# Patient Record
Sex: Female | Born: 1990 | Race: Black or African American | Hispanic: No | Marital: Single | Smoking: Never smoker
Health system: Southern US, Community
[De-identification: ages and names within clinical notes are randomized; demographics above are authoritative.]

## PROBLEM LIST (undated history)

## (undated) ENCOUNTER — Inpatient Hospital Stay (HOSPITAL_COMMUNITY): Payer: Self-pay

## (undated) DIAGNOSIS — O1490 Unspecified pre-eclampsia, unspecified trimester: Secondary | ICD-10-CM

## (undated) DIAGNOSIS — D649 Anemia, unspecified: Secondary | ICD-10-CM

## (undated) HISTORY — PX: NO PAST SURGERIES: SHX2092

## (undated) HISTORY — DX: Unspecified pre-eclampsia, unspecified trimester: O14.90

---

## 2014-07-10 ENCOUNTER — Encounter (HOSPITAL_COMMUNITY): Payer: Self-pay | Admitting: Emergency Medicine

## 2014-07-10 ENCOUNTER — Emergency Department (HOSPITAL_COMMUNITY)
Admission: EM | Admit: 2014-07-10 | Discharge: 2014-07-10 | Disposition: A | Discharge status: Home / Self Care | Payer: Medicaid Other | Attending: Emergency Medicine | Admitting: Emergency Medicine

## 2014-07-10 DIAGNOSIS — O9989 Other specified diseases and conditions complicating pregnancy, childbirth and the puerperium: Secondary | ICD-10-CM | POA: Diagnosis not present

## 2014-07-10 DIAGNOSIS — R5381 Other malaise: Secondary | ICD-10-CM | POA: Insufficient documentation

## 2014-07-10 DIAGNOSIS — R5383 Other fatigue: Secondary | ICD-10-CM

## 2014-07-10 DIAGNOSIS — O99019 Anemia complicating pregnancy, unspecified trimester: Secondary | ICD-10-CM | POA: Diagnosis not present

## 2014-07-10 DIAGNOSIS — D649 Anemia, unspecified: Secondary | ICD-10-CM | POA: Diagnosis not present

## 2014-07-10 DIAGNOSIS — Z3490 Encounter for supervision of normal pregnancy, unspecified, unspecified trimester: Secondary | ICD-10-CM

## 2014-07-10 HISTORY — DX: Anemia, unspecified: D64.9

## 2014-07-10 LAB — CBC
HCT: 39 % (ref 36.0–46.0)
Hemoglobin: 13.3 g/dL (ref 12.0–15.0)
MCH: 29.3 pg (ref 26.0–34.0)
MCHC: 34.1 g/dL (ref 30.0–36.0)
MCV: 85.9 fL (ref 78.0–100.0)
PLATELETS: 239 10*3/uL (ref 150–400)
RBC: 4.54 MIL/uL (ref 3.87–5.11)
RDW: 12.1 % (ref 11.5–15.5)
WBC: 5.1 10*3/uL (ref 4.0–10.5)

## 2014-07-10 LAB — POC URINE PREG, ED: Preg Test, Ur: POSITIVE — AB

## 2014-07-10 MED ORDER — PRENATAL COMPLETE 14-0.4 MG PO TABS: 1.0000 | ORAL_TABLET | Freq: Every morning | ORAL | Status: DC

## 2014-07-10 NOTE — ED Notes (Signed)
Patient presents stating that she donated blood today and is now feeling tired.  Took 3 at home pregnancy tests and they were all negative.

## 2014-07-10 NOTE — ED Notes (Signed)
Hx. Of anemia. Donated plasma today. Became weak. No menstrual; cycle for 3 months.

## 2014-07-10 NOTE — ED Provider Notes (Signed)
CSN: [id number]     Arrival date & time 07/10/14  2029 History   First MD Initiated Contact with Patient 07/10/14 2123     Chief Complaint  Patient presents with  . Anemia  . Weakness     (Consider location/radiation/quality/duration/timing/severity/associated sxs/prior Treatment) HPI Comments: Patient reports, that she has a history of anemia, but has not had a blood level checked recently.  She donated plasma today.  Did not drink any fluids after she donated plasma and when she went for a walk.  She felt slightly dizzy.  This has resolved.  She also reports, that she has not had a regular menstrual cycle since stopping her birth control in many.  She states, that she took several home pregnancy tests all negative.  The last being a week, and a half, ago.  She denies any dysuria, fever, abdominal pain, nausea, vomiting, rapid heartbeat, shortness of breath.  Vaginal discharge, or bleeding  Patient is a 23 y.o. female presenting with anemia and weakness. The history is provided by the patient.  Anemia This is a chronic problem. The problem occurs constantly. The problem has been unchanged. Associated symptoms include weakness. Pertinent negatives include no abdominal pain, chest pain, diaphoresis, fever, headaches, nausea or vomiting. The symptoms are aggravated by walking. She has tried nothing for the symptoms. The treatment provided no relief.  Weakness Associated symptoms include weakness. Pertinent negatives include no abdominal pain, chest pain, diaphoresis, fever, headaches, nausea or vomiting.    Past Medical History  Diagnosis Date  . Anemia    History reviewed. No pertinent past surgical history. History reviewed. No pertinent family history. History  Substance Use Topics  . Smoking status: Never Smoker   . Smokeless tobacco: Not on file  . Alcohol Use: No   OB History   Grav Para Term Preterm Abortions TAB SAB Ect Mult Living                 Review of Systems   Constitutional: Negative for fever, diaphoresis, activity change and appetite change.  Respiratory: Negative for shortness of breath.   Cardiovascular: Negative for chest pain.  Gastrointestinal: Negative for nausea, vomiting and abdominal pain.  Genitourinary: Negative for vaginal bleeding and vaginal discharge.  Neurological: Positive for weakness. Negative for dizziness and headaches.  All other systems reviewed and are negative.     Allergies  Review of patient's allergies indicates no known allergies.  Home Medications   Prior to Admission medications   Medication Sig Start Date End Date Taking? Authorizing Provider  Prenatal Vit-Fe Fumarate-FA (PRENATAL COMPLETE) 14-0.4 MG TABS Take 1 tablet by mouth every morning. 07/10/14   Arman Filter, NP   BP 125/74  Pulse 83  Temp(Src) 98.2 F (36.8 C)  Resp 18  Wt 112 lb 4 oz (50.916 kg)  SpO2 98%  LMP 04/09/2014 Physical Exam  Nursing note and vitals reviewed. Constitutional: She is oriented to person, place, and time. She appears well-developed and well-nourished.  HENT:  Head: Normocephalic.  Eyes: Pupils are equal, round, and reactive to light.  Neck: Normal range of motion.  Cardiovascular: Normal rate and regular rhythm.   Pulmonary/Chest: Effort normal and breath sounds normal.  Abdominal: Soft. Bowel sounds are normal. She exhibits no distension. There is no tenderness.  Musculoskeletal: Normal range of motion.  Neurological: She is alert and oriented to person, place, and time.  Skin: Skin is warm. No pallor.    ED Course  Procedures (including critical care time) Labs Review  Labs Reviewed  POC URINE PREG, ED - Abnormal; Notable for the following:    Preg Test, Ur POSITIVE (*)    All other components within normal limits  CBC    Imaging Review No results found.   EKG Interpretation None     patient's CBC shows that she is not anemic.  She does not have any indication for infection, but her urine  pregnancy test is positive she'll be started on prenatal vitamins [facility]  MDM   Final diagnoses:  Pregnancy         Arman FilterGail K Meosha Castanon, NP 07/10/14 2210

## 2014-07-10 NOTE — Discharge Instructions (Signed)
Congratulations You have been given a prescription for prenatal vitamins.  Please take these as directed once a day  You have been given a referral to women's OB/GYN clinic, please call and make an appointment to start her prenatal care

## 2014-07-11 NOTE — ED Provider Notes (Signed)
Medical screening examination/treatment/procedure(s) were performed by non-physician practitioner and as supervising physician I was immediately available for consultation/collaboration.   EKG Interpretation None       Juliet RudeNathan R. Rubin PayorPickering, MD 07/11/14 [phone]

## 2014-08-07 LAB — OB RESULTS CONSOLE ANTIBODY SCREEN: Antibody Screen: NEGATIVE

## 2014-08-07 LAB — OB RESULTS CONSOLE GC/CHLAMYDIA
Chlamydia: NEGATIVE
Gonorrhea: NEGATIVE

## 2014-08-07 LAB — OB RESULTS CONSOLE ABO/RH: RH Type: POSITIVE

## 2014-08-07 LAB — OB RESULTS CONSOLE RUBELLA ANTIBODY, IGM: Rubella: IMMUNE

## 2014-08-07 LAB — OB RESULTS CONSOLE HEPATITIS B SURFACE ANTIGEN: Hepatitis B Surface Ag: NEGATIVE

## 2014-08-07 LAB — OB RESULTS CONSOLE RPR: RPR: NONREACTIVE

## 2014-08-07 LAB — OB RESULTS CONSOLE HIV ANTIBODY (ROUTINE TESTING): HIV: NONREACTIVE

## 2014-08-14 ENCOUNTER — Encounter: Payer: Self-pay | Admitting: Obstetrics and Gynecology

## 2014-11-27 NOTE — L&D Delivery Note (Signed)
Final Labor Progress Note At 2245 pt reports an increased in rectal pressure.  VE 4-5/80/0.  FHR remained reassuring with occasional variabile decelerations.  Vaginal Delivery Note Spontaneous rupture of membranes today, at 2255, clear.  GBS was positive, PCN not given in time.  Cervical dilation was complete at  2300.  NICHD Category 2.    Pushing with guidance began at  2308.   After 10 minutes of pushing the head, shoulders and the body of a viable female infant "Crystal Santiago" delivered spontaneously with maternal effort in the ROA position at 2318   Tight cord around each ankle, easily unraveled.   With vigorous tone and a slight delay in the cry, the infant was placed on moms abd.  After the umbilical cord was clamped it was cut the infant was handed to the NICU team staff for immediate assessment .  Cord blood was obtained for evaluation.   Spontaneous delivery of a intact placenta with a 3 vessel cord via Shultz at  2326.   Episiotomy: None   The vulva, perineum, vaginal vault, rectum and cervix were inspected and revealed a 2 degree vaginal which was repair using a 3-0 vicryl on a CT needle, a right periurethral laceration which was repaired using a 3-0 vicryl on a CT and a superficial left labial  (hemostatic, not repaired ) needle with 30cc of 1% lidocaine.  Postpartum pitocin as ordered.  Uterus boggy immediately after delivery of placenta - fundal massage and 1000 mcg of Cytotec PR.   EBL 250, Pt hemodynamically stable.   Sponge, laps and needle count correct and verified with the primary care nurse.  Attending MD available at all times.    Mom and baby were left in stable condition, baby skin to skin. Routine postpartum orders   Mother desires Depo for contraception    Placenta to pathology: NO     Cord Gases sent to lab: NO Cord blood sent to lab: YES   APGARS:  9 at 1 minute and 9 at 5 minutes assigned by NICU Weight:. 4lb 8.8oz    Both mom and baby were left in  stable condition     Crystal Santiago, CNM, MSN 02/20/2015. 12:03 AM

## 2015-01-18 ENCOUNTER — Inpatient Hospital Stay (HOSPITAL_COMMUNITY)
Admission: AD | Admit: 2015-01-18 | Discharge: 2015-01-18 | Disposition: A | Discharge status: Home / Self Care | Payer: Medicaid Other | Source: Ambulatory Visit | Attending: Obstetrics and Gynecology | Admitting: Obstetrics and Gynecology

## 2015-01-18 ENCOUNTER — Encounter (HOSPITAL_COMMUNITY): Payer: Self-pay

## 2015-01-18 DIAGNOSIS — O365930 Maternal care for other known or suspected poor fetal growth, third trimester, not applicable or unspecified: Secondary | ICD-10-CM | POA: Diagnosis not present

## 2015-01-18 DIAGNOSIS — Z3A32 32 weeks gestation of pregnancy: Secondary | ICD-10-CM | POA: Insufficient documentation

## 2015-01-18 NOTE — Discharge Instructions (Signed)
Fetal Biophysical Profile °This is a test that measures five different variables of the fetus: Heart rate, breathing movement, total movement of the baby, fetal muscle tone, the amount of amniotic fluid, and the heart rate activity of the fetus. The five variables are measured individually and contribute either a 2 or a 0 to the overall scoring of the test. The measurements are as follows: °· Fetal heart rate activity. This is measured and scored in the same way as a non-stress test. The fetal heart rate is considered reactive when there are movement-associated fetal heart rate increases of at least 15 beats per minute above baseline, and 15 seconds in duration over a 20-minute period. A score of 2 is given for reactivity, and a score of 0 indicates that the fetal heart rate is non-reactive. °· Fetal breathing movements. This is scored based on fetal breathing movements and indicate fetal well-being. Their absence may indicate a low oxygen level for the fetus. Fetal breathing increases in frequency and uniformity after the 36th week of pregnancy. To earn a score of 2, the fetus must have at least one episode of fetal breathing lasting at least 60 seconds within a 30-minute observation. Absence of this breathing is scored a 0 on the BPP. °· Fetal body movements. Fetal activity is a reflection of brain integrity and function. The presence of at least three episodes of fetal movements within a 30-minute period is given a score of 2. A score of 0 is given with two or less movements in this time period. Fetal activity is highest 1 to 3 hours after the mother has eaten a meal. °· Fetal tone. In the uterus, the fetus is normally in a position of flexion. This means the head is bent down towards the knees. The fetus also stretches, rolls, and moves in the uterus. The arms, legs, trunk, and head may be flexed and extended. A score of 2 is earned when there is at least one episode of active extension with return flexion. A  score of 0 is given for slow extension with a return to only partial flexion. Fetal movement not followed by return to flexion, limbs or spine in extension, and an open fetal hand score 0. °· Amniotic fluid volume. Amniotic fluid volume has been demonstrated to be a good method of predicting fetal distress. Too little amniotic fluid has been associated with fetal abnormalities, slow uterine growth, and over due pregnancy. A score of 2 is given for this when there is at least one pocket of amniotic fluid that measures 1 cm in a specific area. A score of 0 indicates either that fluid is absent in most areas of the uterine cavity or that the largest pocket of fluid measures less than 1 cm. °PREPARATION FOR TEST °No preparation or fasting is necessary. °NORMAL FINDINGS °· A score of 8-10 points (if amniotic fluid volume is adequate). °· Possible critical values: Less than 4 may necessitate immediate delivery of fetus. °Ranges for normal findings may vary among different laboratories and hospitals. You should always check with your doctor after having lab work or other tests done to discuss the meaning of your test results and whether your values are considered within normal limits. °MEANING OF TEST  °Your caregiver will go over the test results with you and discuss the importance and meaning of your results, as well as treatment options and the need for additional tests if necessary. °OBTAINING THE TEST RESULTS  °It is your responsibility to obtain your test   results. Ask the lab or department performing the test when and how you will get your results. °Document Released: 03/16/2005 Document Revised: 02/05/2012 Document Reviewed: 10/23/2008 °ExitCare® Patient Information ©2015 [employer]. This information is not intended to replace advice given to you by your health care provider. Make sure you discuss any questions you have with your health care provider. ° °

## 2015-01-18 NOTE — MAU Provider Note (Signed)
History    Crystal Santiago is a 23y.o. G1P0 at 32.6wks who presents, from office, for NST.  Patient had BPP today of 6/8 with -2 for breathing.  Receiving BPP for SGA, but most recent growth US with fetus in 13th%ile.  Patient reports some occasional contractions, good fetal movement, no Lof or VB.   There are no active problems to display for this patient.   Chief Complaint  Patient presents with  . Non-stress Test   HPI  OB History    Gravida Para Term Preterm AB TAB SAB Ectopic Multiple Living   1               Past Medical History  Diagnosis Date  . Anemia     History reviewed. No pertinent past surgical history.  History reviewed. No pertinent family history.  History  Substance Use Topics  . Smoking status: Never Smoker   . Smokeless tobacco: Not on file  . Alcohol Use: No    Allergies: No Known Allergies  Prescriptions prior to admission  Medication Sig Dispense Refill Last Dose  . Prenatal Vit-Fe Fumarate-FA (PRENATAL COMPLETE) 14-0.4 MG TABS Take 1 tablet by mouth every morning. 60 each 0 01/17/2015 at 2100    ROS  See HPI Above Physical Exam   Blood pressure 120/73, pulse 69, temperature 98 F (36.7 C), temperature source Oral, resp. rate 18, last menstrual period 04/09/2014.  Physical Exam FHR: 125 bpm, Mod Var, -Decels, +Accels UC: Irregular, graphs mild ED Course  Assessment: IUP at 32.6wks Cat I FT Reactive NST SGA  Plan: -NST reactive in 20 minutes -Overall Antenatal testing today 8/10 -Reasurring -Follow up in office for repeat BPP next week -PTL Precautions -Who and when to call for pregnancy related concerns -Encouraged to call if any questions or concerns arise prior to next scheduled office visit.   Tracye Szuch LYNN CNM, MSN 01/18/2015 7:03 PM

## 2015-01-18 NOTE — MAU Note (Signed)
Pt had 6/8 BPP in office, sent to MAU for NST.

## 2015-02-15 LAB — US OB FOLLOW UP

## 2015-02-16 ENCOUNTER — Other Ambulatory Visit (HOSPITAL_COMMUNITY): Payer: Self-pay | Admitting: Obstetrics and Gynecology

## 2015-02-16 DIAGNOSIS — O365933 Maternal care for other known or suspected poor fetal growth, third trimester, fetus 3: Secondary | ICD-10-CM

## 2015-02-16 DIAGNOSIS — O26843 Uterine size-date discrepancy, third trimester: Secondary | ICD-10-CM

## 2015-02-16 LAB — OB RESULTS CONSOLE GBS: GBS: POSITIVE

## 2015-02-17 ENCOUNTER — Ambulatory Visit (HOSPITAL_COMMUNITY)
Admission: RE | Admit: 2015-02-17 | Discharge: 2015-02-17 | Disposition: A | Discharge status: Home / Self Care | Payer: Medicaid Other | Source: Ambulatory Visit | Attending: Obstetrics and Gynecology | Admitting: Obstetrics and Gynecology

## 2015-02-17 ENCOUNTER — Encounter (HOSPITAL_COMMUNITY): Payer: Self-pay

## 2015-02-17 ENCOUNTER — Other Ambulatory Visit: Payer: Self-pay | Admitting: Obstetrics and Gynecology

## 2015-02-17 DIAGNOSIS — O365933 Maternal care for other known or suspected poor fetal growth, third trimester, fetus 3: Secondary | ICD-10-CM

## 2015-02-17 DIAGNOSIS — O26843 Uterine size-date discrepancy, third trimester: Secondary | ICD-10-CM | POA: Diagnosis not present

## 2015-02-18 ENCOUNTER — Encounter (HOSPITAL_COMMUNITY): Payer: Self-pay | Admitting: *Deleted

## 2015-02-18 ENCOUNTER — Encounter (HOSPITAL_COMMUNITY): Payer: Self-pay | Admitting: Obstetrics and Gynecology

## 2015-02-18 ENCOUNTER — Telehealth (HOSPITAL_COMMUNITY): Payer: Self-pay | Admitting: *Deleted

## 2015-02-18 NOTE — Telephone Encounter (Signed)
Preadmission screen  

## 2015-02-19 ENCOUNTER — Inpatient Hospital Stay (HOSPITAL_COMMUNITY): Payer: Medicaid Other

## 2015-02-19 ENCOUNTER — Other Ambulatory Visit (HOSPITAL_COMMUNITY): Payer: Self-pay | Admitting: Certified Nurse Midwife

## 2015-02-19 ENCOUNTER — Inpatient Hospital Stay (HOSPITAL_COMMUNITY)
Admission: AD | Admit: 2015-02-19 | Discharge: 2015-02-22 | DRG: 774 | Disposition: A | Discharge status: Home / Self Care | Payer: Medicaid Other | Source: Ambulatory Visit | Attending: Obstetrics and Gynecology | Admitting: Obstetrics and Gynecology

## 2015-02-19 ENCOUNTER — Encounter (HOSPITAL_COMMUNITY): Payer: Self-pay | Admitting: *Deleted

## 2015-02-19 DIAGNOSIS — Z3A37 37 weeks gestation of pregnancy: Secondary | ICD-10-CM | POA: Diagnosis present

## 2015-02-19 DIAGNOSIS — O99824 Streptococcus B carrier state complicating childbirth: Secondary | ICD-10-CM | POA: Diagnosis present

## 2015-02-19 DIAGNOSIS — O1493 Unspecified pre-eclampsia, third trimester: Secondary | ICD-10-CM | POA: Diagnosis present

## 2015-02-19 DIAGNOSIS — Z22330 Carrier of Group B streptococcus: Secondary | ICD-10-CM

## 2015-02-19 DIAGNOSIS — O365930 Maternal care for other known or suspected poor fetal growth, third trimester, not applicable or unspecified: Secondary | ICD-10-CM | POA: Diagnosis present

## 2015-02-19 DIAGNOSIS — IMO0002 Reserved for concepts with insufficient information to code with codable children: Secondary | ICD-10-CM | POA: Diagnosis present

## 2015-02-19 DIAGNOSIS — Z3483 Encounter for supervision of other normal pregnancy, third trimester: Secondary | ICD-10-CM | POA: Diagnosis present

## 2015-02-19 LAB — COMPREHENSIVE METABOLIC PANEL
ALBUMIN: 3 g/dL — AB (ref 3.5–5.2)
ALK PHOS: 205 U/L — AB (ref 39–117)
ALT: 10 U/L (ref 0–35)
AST: 19 U/L (ref 0–37)
Anion gap: 5 (ref 5–15)
BILIRUBIN TOTAL: 0.3 mg/dL (ref 0.3–1.2)
BUN: 10 mg/dL (ref 6–23)
CHLORIDE: 104 mmol/L (ref 96–112)
CO2: 26 mmol/L (ref 19–32)
CREATININE: 0.92 mg/dL (ref 0.50–1.10)
Calcium: 8.2 mg/dL — ABNORMAL LOW (ref 8.4–10.5)
GFR calc Af Amer: >90 mL/min (ref >90)
GFR, EST NON AFRICAN AMERICAN: 87 mL/min — AB (ref >90)
Glucose, Bld: 100 mg/dL — ABNORMAL HIGH (ref 70–99)
POTASSIUM: 3.9 mmol/L (ref 3.5–5.1)
Sodium: 135 mmol/L (ref 135–145)
Total Protein: 6.6 g/dL (ref 6.0–8.3)

## 2015-02-19 LAB — PROTEIN / CREATININE RATIO, URINE
Creatinine, Urine: 66.29 mg/dL
Protein Creatinine Ratio: 1.58 — ABNORMAL HIGH (ref 0.00–0.15)
TOTAL PROTEIN, URINE: 105 mg/dL

## 2015-02-19 LAB — LACTATE DEHYDROGENASE: LDH: 187 U/L (ref 94–250)

## 2015-02-19 LAB — CBC
HCT: 33.9 % — ABNORMAL LOW (ref 36.0–46.0)
Hemoglobin: 11 g/dL — ABNORMAL LOW (ref 12.0–15.0)
MCH: 30.1 pg (ref 26.0–34.0)
MCHC: 32.4 g/dL (ref 30.0–36.0)
MCV: 92.9 fL (ref 78.0–100.0)
Platelets: 198 10*3/uL (ref 150–400)
RBC: 3.65 MIL/uL — ABNORMAL LOW (ref 3.87–5.11)
RDW: 15.3 % (ref 11.5–15.5)
WBC: 6 10*3/uL (ref 4.0–10.5)

## 2015-02-19 LAB — URIC ACID: Uric Acid, Serum: 6 mg/dL (ref 2.4–7.0)

## 2015-02-19 MED ORDER — FENTANYL 2.5 MCG/ML BUPIVACAINE 1/10 % EPIDURAL INFUSION (WH - ANES): 14.0000 mL/h | INTRAMUSCULAR | Status: DC | PRN

## 2015-02-19 MED ORDER — OXYTOCIN 40 UNITS IN LACTATED RINGERS INFUSION - SIMPLE MED
62.5000 mL/h | INTRAVENOUS | Status: DC
  Filled 2015-02-19: qty 1000

## 2015-02-19 MED ORDER — LACTATED RINGERS IV SOLN: 500.0000 mL | INTRAVENOUS | Status: DC | PRN

## 2015-02-19 MED ORDER — OXYCODONE-ACETAMINOPHEN 5-325 MG PO TABS: 2.0000 | ORAL_TABLET | ORAL | Status: DC | PRN

## 2015-02-19 MED ORDER — PENICILLIN G POTASSIUM 5000000 UNITS IJ SOLR
5.0000 10*6.[IU] | Freq: Once | INTRAMUSCULAR | Status: DC
  Filled 2015-02-19: qty 5

## 2015-02-19 MED ORDER — DIPHENHYDRAMINE HCL 50 MG/ML IJ SOLN: 12.5000 mg | INTRAMUSCULAR | Status: DC | PRN

## 2015-02-19 MED ORDER — NALBUPHINE HCL 10 MG/ML IJ SOLN
5.0000 mg | INTRAMUSCULAR | Status: DC | PRN
  Administered 2015-02-19: 5 mg via INTRAVENOUS
  Filled 2015-02-19: qty 1

## 2015-02-19 MED ORDER — OXYCODONE-ACETAMINOPHEN 5-325 MG PO TABS: 1.0000 | ORAL_TABLET | ORAL | Status: DC | PRN

## 2015-02-19 MED ORDER — SODIUM CHLORIDE 0.9 % IJ SOLN: 3.0000 mL | INTRAMUSCULAR | Status: DC | PRN

## 2015-02-19 MED ORDER — TERBUTALINE SULFATE 1 MG/ML IJ SOLN: 0.2500 mg | Freq: Once | INTRAMUSCULAR | Status: AC | PRN

## 2015-02-19 MED ORDER — PHENYLEPHRINE 40 MCG/ML (10ML) SYRINGE FOR IV PUSH (FOR BLOOD PRESSURE SUPPORT)
80.0000 ug | PREFILLED_SYRINGE | INTRAVENOUS | Status: DC | PRN
  Filled 2015-02-19: qty 2

## 2015-02-19 MED ORDER — ONDANSETRON HCL 4 MG/2ML IJ SOLN
4.0000 mg | Freq: Four times a day (QID) | INTRAMUSCULAR | Status: DC | PRN
  Administered 2015-02-19: 4 mg via INTRAVENOUS
  Filled 2015-02-19: qty 2

## 2015-02-19 MED ORDER — MISOPROSTOL 200 MCG PO TABS
1000.0000 ug | ORAL_TABLET | Freq: Once | ORAL | Status: AC
Start: 2015-02-19 — End: 2015-02-19
  Administered 2015-02-19: 1000 ug via RECTAL

## 2015-02-19 MED ORDER — LIDOCAINE HCL (PF) 1 % IJ SOLN
30.0000 mL | INTRAMUSCULAR | Status: AC | PRN
  Administered 2015-02-19: 30 mL via SUBCUTANEOUS
  Filled 2015-02-19: qty 30

## 2015-02-19 MED ORDER — SODIUM CHLORIDE 0.9 % IJ SOLN: 3.0000 mL | Freq: Two times a day (BID) | INTRAMUSCULAR | Status: DC

## 2015-02-19 MED ORDER — PENICILLIN G POTASSIUM 5000000 UNITS IJ SOLR
2.5000 10*6.[IU] | INTRAVENOUS | Status: DC
  Filled 2015-02-19 (×2): qty 2.5

## 2015-02-19 MED ORDER — MISOPROSTOL 200 MCG PO TABS
ORAL_TABLET | ORAL | Status: AC
  Filled 2015-02-19: qty 5

## 2015-02-19 MED ORDER — PENICILLIN G POTASSIUM 5000000 UNITS IJ SOLR
5.0000 10*6.[IU] | Freq: Once | INTRAVENOUS | Status: DC
  Filled 2015-02-19: qty 5

## 2015-02-19 MED ORDER — LACTATED RINGERS IV SOLN
INTRAVENOUS | Status: DC
  Administered 2015-02-19: 17:00:00 via INTRAVENOUS

## 2015-02-19 MED ORDER — SODIUM CHLORIDE 0.9 % IV SOLN: 250.0000 mL | INTRAVENOUS | Status: DC | PRN

## 2015-02-19 MED ORDER — ACETAMINOPHEN 325 MG PO TABS: 650.0000 mg | ORAL_TABLET | ORAL | Status: DC | PRN

## 2015-02-19 MED ORDER — CITRIC ACID-SODIUM CITRATE 334-500 MG/5ML PO SOLN
30.0000 mL | ORAL | Status: DC | PRN
Start: 2015-02-19 — End: 2015-02-20

## 2015-02-19 MED ORDER — EPHEDRINE 5 MG/ML INJ
10.0000 mg | INTRAVENOUS | Status: DC | PRN
  Filled 2015-02-19: qty 2

## 2015-02-19 MED ORDER — OXYTOCIN BOLUS FROM INFUSION
500.0000 mL | INTRAVENOUS | Status: DC
  Administered 2015-02-19: 500 mL via INTRAVENOUS

## 2015-02-19 MED ORDER — PENICILLIN G POTASSIUM 5000000 UNITS IJ SOLR
2.5000 10*6.[IU] | INTRAVENOUS | Status: DC
  Filled 2015-02-19 (×3): qty 2.5

## 2015-02-19 MED ORDER — MISOPROSTOL 25 MCG QUARTER TABLET
25.0000 ug | ORAL_TABLET | ORAL | Status: DC | PRN
  Administered 2015-02-19: 25 ug via VAGINAL
  Filled 2015-02-19: qty 1
  Filled 2015-02-19: qty 0.25

## 2015-02-19 MED ORDER — LACTATED RINGERS IV SOLN: 500.0000 mL | Freq: Once | INTRAVENOUS | Status: DC

## 2015-02-19 NOTE — H&P (Signed)
Crystal PercyJustina L Santiago is a 24 y.o. female, G 2 P0 at 37.3 weeks  Patient Active Problem List   Diagnosis Date Noted  . IUGR (intrauterine growth restriction)   . [redacted] weeks gestation of pregnancy     Pregnancy Course: Patient entered care at  13.2 weeks.   EDC of  03/09/15 was established by US.      US evaluations:     17.0 weeks - Anatomy:EFW 6 oz. 4 %tile . Breech pres. Maternal ovaries appear WNL. Cx long and closed, measuring 4.4 cm. Posterior L lateral placenta measures 3.4 cm from the internal OS. AFI WNL limits. Limited by early gestation. Recommend re-eval for dating. Open hand, 5th digit, palate and great vessels are seen      19.6 weeks - FU: EFW 10oz. 14%tile. Vertex presentation. Nl fluid. AP pocket=4.3 cm. Meas. consistent w/in 5 days of established GA/EDD. Suggest F/U to assess linear growth. Female gender. Note: echogenicity of fetal kidneys appear nl. Bowels appear nl. Anatomy is complete. Cx closed/ Nl adnexas.  23.6 weeks - EFW 1lb 5oz - 27.7%, cervix 3.67, FHR 151, vertex, posterior, posterior lateral placenta  26.0 weeks - EFW 1lb 15oz 0 13%, FHR 146, cervix 3.17,  Vertex, posterior placenta, normal fluid   Significant prenatal events:   IUGR  Last evaluation:   36.6 weeks     Reason for admission:  IOL for IUGR   Pt States:   Contractions Frequency: none         Contraction severity: n/a         Fetal activity: +FM  OB History    Gravida Para Term Preterm AB TAB SAB Ectopic Multiple Living   2    1  1         Past Medical History  Diagnosis Date  . Anemia   . IUGR (intrauterine growth restriction)    Past Surgical History  Procedure Laterality Date  . No past surgeries     Family History: family history includes Alcohol abuse in her father; Bipolar disorder in her maternal aunt; Diabetes in her maternal grandfather, maternal grandmother, paternal grandfather, and paternal grandmother; Hypertension in her maternal grandmother and paternal grandmother. Social  History:  reports that she has never smoked. She has never used smokeless tobacco. She reports that she does not drink alcohol or use illicit drugs.   Prenatal Transfer Tool  Maternal Diabetes: No Genetic Screening: Normal Maternal Ultrasounds/Referrals: Abnormal:  Findings:   IUGR Fetal Ultrasounds or other Referrals:  Referred to Materal Fetal Medicine  Maternal Substance Abuse:  No Significant Maternal Medications:  None Significant Maternal Lab Results: Lab values include: Group B Strep positive   ROS:  See HPI above, all other systems are negative  No Known Allergies  Dilation: Closed Effacement (%): Thick Station: -2 Exam by:: wilkins Blood pressure 136/87, pulse 72, temperature 98.4 F (36.9 C), temperature source Oral, resp. rate 18, height 5\' 6"  (1.676 m), weight 142 lb (64.411 kg).  Maternal Exam:  Uterine Assessment: Contraction frequency is rare.  Abdomen: Gravid, non tender. Fundal height is aga.  Normal external genitalia, vulva, cervix, uterus and adnexa.  No lesions noted on exam.  Pelvis adequate for delivery.  Fetal presentation: Vertex by VE  Fetal Exam:  Monitor Surveillance : Continuous Monitoring Mode: Ultrasound.  NICHD: Category CTXs: none EFW   4lb  Physical Exam: Nursing note and vitals reviewed General: alert and cooperative She appears well nourished Psychiatric: Normal mood and affect. Her behavior is normal Head:  Normocephalic Eyes: Pupils are equal, round, and reactive to light Neck: Normal range of motion Cardiovascular: RRR without murmur  Respiratory: CTAB. Effort normal  Abd: soft, non-tender, +BS, no rebound, no guarding  Genitourinary: Vagina normal  Neurological: A&Ox3 Skin: Warm and dry  Musculoskeletal: Normal range of motion  Homan's sign negative bilaterally No evidence of DVTs.  Edema: Minimal bilaterally non-pitting edema DTR: 2+ Clonus: None   Prenatal labs: ABO, Rh: O/Positive/-- (09/11 0000) Antibody:  Negative (09/11 0000) Rubella:   immune RPR: Nonreactive (09/11 0000)  HBsAg: Negative (09/11 0000)  HIV: Non-reactive (09/11 0000)  GBS: Positive (03/22 0000) Sickle cell/Hgb electrophoresis:  WNL Pap:  abn GC:   negative Chlamydia: negative Genetic screenings:  negative Glucola:  wnl  Assessment:  IUP at 37.3 weeks NICHD: Category 1 Membranes: intact Bishop Score: 0 GBS positive Diagnosis:   Plan:  Admit to L&D for IOL d/t IOL options reviewed with patient including cytotec, foley bulb, AROM, and pitocin R&B of IOL reviewed including serial induction, failure, and/or CS requirement Pt and family verbalize understanding and agree with treatment plan.  Start induction with cytotec GBS prophylaxis with PCN G per Nashayla Telleria dosing with onset of active labor.  Okay to ambulate around unit with wireless monitors  Okay to get up and shower without monitoring  Regular diet prior to starting pitocin Clear/Thin diet after pitocin starts  Will place next dose cytotec or foley bulb at 2100 Continue with labor mgmt as ordered  IV pain medication per orders PRN Epidural per patient request Foley cath after patient is comfortable with epidural  Anticipate SVD   Attending MD available at all times.   Jasani Dolney, CNM, MSN 02/19/2015, 7:33 PM       All information will be confirmed upon admisson

## 2015-02-19 NOTE — Progress Notes (Signed)
Provider notified of prolonged decel and late variables.  Also notified of high B/P.  Provider states she will be making rounds shortly.

## 2015-02-19 NOTE — MAU Note (Signed)
Pt sent from home for NST and BPP due to IUGR that was dx yesterday in the clinic.

## 2015-02-19 NOTE — MAU Note (Addendum)
[facility]. Will go to [phone]

## 2015-02-19 NOTE — Progress Notes (Signed)
Labor Progress  Subjective: Starting to get uncomfortable with each ctx  Objective: BP 145/95 mmHg  Pulse 75  Temp(Src) 98.6 F (37 C) (Oral)  Resp 18  Ht 5\' 6"  (1.676 m)  Wt 142 lb (64.411 kg)  BMI 22.93 kg/m2     FHT: 140, moderate variability, + accel, 3 minute decels starting at 2035 repeating until 2044.  Returning to baseline of 140 with moderate variability and occasional variable. CTX:  regular, every 2-3 minutes Uterus gravid, soft non tender SVE:  4-5/80/-1   Assessment:  IUP at 37.3 weeks NICHD: Category 2 & 3 With Category 3 with active intrauterine resuscitative measures at  Membranes:  intact Labor progress: IOL GBS: positive   Plan: Continue labor plan Continuous monitoring Epidural per pt request Frequent position changes to facilitate fetal rotation and descent. Will reassess with cervical exam at 1100 or earlier if necessary AROM at next check Start Abx      Crystal Santiago, CNM, MSN 02/19/2015. 9:06 PM

## 2015-02-19 NOTE — Progress Notes (Signed)
Called provider to notify of decels and contractions and elevated b/p.

## 2015-02-20 ENCOUNTER — Encounter (HOSPITAL_COMMUNITY): Payer: Self-pay | Admitting: *Deleted

## 2015-02-20 DIAGNOSIS — Z22330 Carrier of Group B streptococcus: Secondary | ICD-10-CM

## 2015-02-20 DIAGNOSIS — O1493 Unspecified pre-eclampsia, third trimester: Secondary | ICD-10-CM

## 2015-02-20 DIAGNOSIS — IMO0002 Reserved for concepts with insufficient information to code with codable children: Secondary | ICD-10-CM

## 2015-02-20 LAB — CBC
HCT: 31.8 % — ABNORMAL LOW (ref 36.0–46.0)
HEMOGLOBIN: 10.5 g/dL — AB (ref 12.0–15.0)
MCH: 30.5 pg (ref 26.0–34.0)
MCHC: 33 g/dL (ref 30.0–36.0)
MCV: 92.4 fL (ref 78.0–100.0)
Platelets: 180 10*3/uL (ref 150–400)
RBC: 3.44 MIL/uL — ABNORMAL LOW (ref 3.87–5.11)
RDW: 15.1 % (ref 11.5–15.5)
WBC: 11.4 10*3/uL — AB (ref 4.0–10.5)

## 2015-02-20 LAB — MRSA PCR SCREENING: MRSA by PCR: NEGATIVE

## 2015-02-20 LAB — TYPE AND SCREEN
ABO/RH(D): O POS
Antibody Screen: NEGATIVE

## 2015-02-20 LAB — RPR: RPR: NONREACTIVE

## 2015-02-20 LAB — ABO/RH: ABO/RH(D): O POS

## 2015-02-20 MED ORDER — LANOLIN HYDROUS EX OINT: TOPICAL_OINTMENT | CUTANEOUS | Status: DC | PRN

## 2015-02-20 MED ORDER — TETANUS-DIPHTH-ACELL PERTUSSIS 5-2.5-18.5 LF-MCG/0.5 IM SUSP
0.5000 mL | Freq: Once | INTRAMUSCULAR | Status: DC
  Filled 2015-02-20: qty 0.5

## 2015-02-20 MED ORDER — LACTATED RINGERS IV SOLN
INTRAVENOUS | Status: DC
  Administered 2015-02-20 (×2): via INTRAVENOUS

## 2015-02-20 MED ORDER — WITCH HAZEL-GLYCERIN EX PADS: 1.0000 "application " | MEDICATED_PAD | CUTANEOUS | Status: DC | PRN

## 2015-02-20 MED ORDER — ACETAMINOPHEN 325 MG PO TABS: 650.0000 mg | ORAL_TABLET | ORAL | Status: DC | PRN

## 2015-02-20 MED ORDER — ONDANSETRON HCL 4 MG/2ML IJ SOLN: 4.0000 mg | INTRAMUSCULAR | Status: DC | PRN

## 2015-02-20 MED ORDER — SIMETHICONE 80 MG PO CHEW: 80.0000 mg | CHEWABLE_TABLET | ORAL | Status: DC | PRN

## 2015-02-20 MED ORDER — OXYCODONE-ACETAMINOPHEN 5-325 MG PO TABS
1.0000 | ORAL_TABLET | ORAL | Status: DC | PRN
  Administered 2015-02-20: 1 via ORAL
  Filled 2015-02-20: qty 1

## 2015-02-20 MED ORDER — BENZOCAINE-MENTHOL 20-0.5 % EX AERO
1.0000 "application " | INHALATION_SPRAY | CUTANEOUS | Status: DC | PRN
  Filled 2015-02-20 (×2): qty 56

## 2015-02-20 MED ORDER — MAGNESIUM SULFATE BOLUS VIA INFUSION
4.0000 g | Freq: Once | INTRAVENOUS | Status: AC
  Administered 2015-02-20: 4 g via INTRAVENOUS
  Filled 2015-02-20: qty 500

## 2015-02-20 MED ORDER — ONDANSETRON HCL 4 MG PO TABS: 4.0000 mg | ORAL_TABLET | ORAL | Status: DC | PRN

## 2015-02-20 MED ORDER — OXYCODONE-ACETAMINOPHEN 5-325 MG PO TABS: 2.0000 | ORAL_TABLET | ORAL | Status: DC | PRN

## 2015-02-20 MED ORDER — MAGNESIUM SULFATE 40 G IN LACTATED RINGERS - SIMPLE
2.0000 g/h | INTRAVENOUS | Status: DC
  Administered 2015-02-20 – 2015-02-21 (×2): 2 g/h via INTRAVENOUS
  Filled 2015-02-20 (×2): qty 500

## 2015-02-20 MED ORDER — ZOLPIDEM TARTRATE 5 MG PO TABS
5.0000 mg | ORAL_TABLET | Freq: Every evening | ORAL | Status: DC | PRN
  Administered 2015-02-21: 5 mg via ORAL
  Filled 2015-02-20: qty 1

## 2015-02-20 MED ORDER — DIPHENHYDRAMINE HCL 25 MG PO CAPS: 25.0000 mg | ORAL_CAPSULE | Freq: Four times a day (QID) | ORAL | Status: DC | PRN

## 2015-02-20 MED ORDER — FERROUS SULFATE 325 (65 FE) MG PO TABS
325.0000 mg | ORAL_TABLET | Freq: Two times a day (BID) | ORAL | Status: DC
  Administered 2015-02-20 – 2015-02-22 (×5): 325 mg via ORAL
  Filled 2015-02-20 (×5): qty 1

## 2015-02-20 MED ORDER — PRENATAL MULTIVITAMIN CH
1.0000 | ORAL_TABLET | Freq: Every day | ORAL | Status: DC
  Administered 2015-02-20 – 2015-02-22 (×3): 1 via ORAL
  Filled 2015-02-20 (×3): qty 1

## 2015-02-20 MED ORDER — MEDROXYPROGESTERONE ACETATE 150 MG/ML IM SUSP
150.0000 mg | INTRAMUSCULAR | Status: DC | PRN
  Filled 2015-02-20: qty 1

## 2015-02-20 MED ORDER — IBUPROFEN 600 MG PO TABS
600.0000 mg | ORAL_TABLET | Freq: Four times a day (QID) | ORAL | Status: DC
  Administered 2015-02-20 – 2015-02-22 (×10): 600 mg via ORAL
  Filled 2015-02-20 (×10): qty 1

## 2015-02-20 MED ORDER — DIBUCAINE 1 % RE OINT: 1.0000 "application " | TOPICAL_OINTMENT | RECTAL | Status: DC | PRN

## 2015-02-20 MED ORDER — LABETALOL HCL 5 MG/ML IV SOLN: 20.0000 mg | INTRAVENOUS | Status: DC | PRN

## 2015-02-20 MED ORDER — HYDRALAZINE HCL 20 MG/ML IJ SOLN: 10.0000 mg | Freq: Once | INTRAMUSCULAR | Status: AC | PRN

## 2015-02-20 MED ORDER — SENNOSIDES-DOCUSATE SODIUM 8.6-50 MG PO TABS
2.0000 | ORAL_TABLET | ORAL | Status: DC
  Administered 2015-02-20: 2 via ORAL
  Filled 2015-02-20 (×2): qty 2

## 2015-02-20 NOTE — Discharge Summary (Signed)
Vaginal Delivery Discharge Summary  Crystal Santiago  DOB:    May 18, 1991 MRN:    [id number] CSN:    [id number]  Date of admission:                  02/19/15  Date of discharge:                   02/21/15  Procedures this admission:   SVB, induction of labor for IUGR, magnesium sulfate x 24 hours pp  Date of Delivery: 02/19/15  Newborn Data:  Live born female  Birth Weight: 4 lb 8.8 oz (2064 g) APGAR: 9, 9  Remains as baby patient at time of mother's d/c. Name: Crystal Santiago   History of Present Illness:  Crystal Santiago is a 24 y.o. female, G2P1011, who presents at [redacted]w[redacted]d weeks gestation. The patient has been followed at the [facility]. She was admitted induction of labor due to IUGR. Her pregnancy has been complicated by:  Patient Active Problem List   Diagnosis Date Noted  . Pre-eclampsia in third trimester 02/20/2015  . 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 Vaginal delivery 02/20/2015  . IUGR (intrauterine growth restriction)      Hospital Course:  Admitted 02/19/15 for induction due to IUGR, based on MFM recommendation. Positive GBS. Progressed after a single dose of Cytotech. Utilized [place] for pain management.  BP was elevated on admission, with elevated PCR on testing, leading to dx of pre-eclampsia.  Delivery was performed by [facility], CNM, without complication. Patient and baby tolerated the procedure without difficulty, with 2nd degree vaginal, right periurethral laceration, and superficial left labial (not repaired) laceration noted. Infant status was stable and remained in room with mother. Patient was maintained on magnesium sulfate x 24 hours pp, with BP remaining mildly elevated (130-150s/80-90s) even after magnesium d/c'd.  Mother and infant had an uncomplicated postpartum course, with breast feeding/pumping/bottlefeeding going well. Mom's physical exam was WNL, and she was discharged home in stable  condition. Contraception plan was Depo Provera, to start at 6 weeks.  She received adequate benefit from po pain medications, using Motrin for pain with benefit.  She was begun on Labetalol 200 mg po BID on day of d/c.  Baby was to remain a "baby patient" due to IUGR.   Feeding:  Breast/bottle  Contraception:  Depo Provera   Discharge hemoglobin:  HEMOGLOBIN  Date Value Ref Range Status  02/21/2015 10.5* 12.0 - 15.0 g/dL Final   HCT  Date Value Ref Range Status  02/21/2015 31.4* 36.0 - 46.0 % Final    Discharge Physical Exam:   General: alert Lochia: appropriate Uterine Fundus: firm Incision: healing well DVT Evaluation: No evidence of DVT seen on physical exam. Negative Homan's sign.  Intrapartum Procedures: spontaneous vaginal delivery Postpartum Procedures: Magnesium sulfate x 24 hours pp Complications-Operative and Postpartum: 2nd degree vaginal, right and left labial, with right not requiring repair.  Discharge Diagnoses: Term Pregnancy-delivered and IUGR, pre-eclampsia, GBS  Discharge Information:  Activity:           pelvic rest Diet:                routine Medications: Ibuprofen and Labetalol Condition:      stable Instructions:     Discharge to: home  Follow-up Information    Follow up with [facility] In 1 week.   Specialty:  Obstetrics and Gynecology   Why:  Office  will call to schedule a visit for you in 1 week to recheck your blood pressure.   Contact information:   [address] [place] [id number] [phone]       Nigel BridgemanLATHAM, Lukus Binion CNM 02/22/2015 10:08 AM

## 2015-02-20 NOTE — Progress Notes (Signed)
Subjective: Postpartum Day 1: Vaginal delivery, 2nd degree vaginal, right periurethral, superficial left labial lacerations On magnesium sulfate x 24 hours pp--denies HA, visual sx, epigastric pain.  Up to BR without syncope or dizziness. Feeding:  Breast/pumping Contraceptive plan:  Depo  Baby remains in room with mom.  Objective: Vital signs in last 24 hours: Temp:  [98.1 F (36.7 C)-98.6 F (37 C)] 98.1 F (36.7 C) (03/26 0800) Pulse Rate:  [67-205] 82 (03/26 0900) Resp:  [18-20] 18 (03/26 0900) BP: (122-166)/(69-113) 149/85 mmHg (03/26 0900) SpO2:  [98 %-99 %] 99 % (03/26 0510)   Filed Vitals:   02/20/15 0650 02/20/15 0702 02/20/15 0800 02/20/15 0900  BP: 122/88 129/98 130/71 149/85  Pulse: 84 76 74 82  Temp:   98.1 F (36.7 C)   TempSrc:   Oral   Resp: 18 18 20 18   Height:      Weight:      SpO2:       I/O balance since delivery:  Not documented  On magnesium 2 gm/hr.  Physical Exam:  General: alert Lochia: appropriate Uterine Fundus: firm Perineum: healing well DVT Evaluation: No evidence of DVT seen on physical exam. Negative Homan's sign.  CBC Latest Ref Rng 02/20/2015 02/19/2015 07/10/2014  WBC 4.0 - 10.5 K/uL 11.4(H) 6.0 5.1  Hemoglobin 12.0 - 15.0 g/dL 10.5(L) 11.0(L) 13.3  Hematocrit 36.0 - 46.0 % 31.8(L) 33.9(L) 39.0  Platelets 150 - 400 K/uL 180 198 239     Assessment/Plan: Status post vaginal delivery day 1 IUGR  Pre-eclampsia. Stable Continue current care. Continue magnesium sulfate until 24 hours pp--at 2318 tonight.   Nyra CapesLATHAM, VICKICNM 02/20/2015, 10:28 AM

## 2015-02-20 NOTE — Progress Notes (Signed)
Provider at bedside, vs assessed.

## 2015-02-20 NOTE — Progress Notes (Signed)
Provider notified of b/p's and lab results

## 2015-02-20 NOTE — Progress Notes (Signed)
Addendum Results for orders placed or performed during the hospital encounter of 02/19/15 (from the past 24 hour(s))  CBC     Status: Abnormal   Collection Time: 02/19/15  4:30 PM  Result Value Ref Range   WBC 6.0 4.0 - 10.5 K/uL   RBC 3.65 (L) 3.87 - 5.11 MIL/uL   Hemoglobin 11.0 (L) 12.0 - 15.0 g/dL   HCT 65.733.9 (L) 84.636.0 - 96.246.0 %   MCV 92.9 78.0 - 100.0 fL   MCH 30.1 26.0 - 34.0 pg   MCHC 32.4 30.0 - 36.0 g/dL   RDW 95.215.3 84.111.5 - 32.415.5 %   Platelets 198 150 - 400 K/uL  Protein / creatinine ratio, urine     Status: Abnormal   Collection Time: 02/19/15  9:17 PM  Result Value Ref Range   Creatinine, Urine 66.29 mg/dL   Total Protein, Urine 105 mg/dL   Protein Creatinine Ratio 1.58 (H) 0.00 - 0.15  Comprehensive metabolic panel     Status: Abnormal   Collection Time: 02/19/15  9:50 PM  Result Value Ref Range   Sodium 135 135 - 145 mmol/L   Potassium 3.9 3.5 - 5.1 mmol/L   Chloride 104 96 - 112 mmol/L   CO2 26 19 - 32 mmol/L   Glucose, Bld 100 (H) 70 - 99 mg/dL   BUN 10 6 - 23 mg/dL   Creatinine, Ser 4.010.92 0.50 - 1.10 mg/dL   Calcium 8.2 (L) 8.4 - 10.5 mg/dL   Total Protein 6.6 6.0 - 8.3 g/dL   Albumin 3.0 (L) 3.5 - 5.2 g/dL   AST 19 0 - 37 U/L   ALT 10 0 - 35 U/L   Alkaline Phosphatase 205 (H) 39 - 117 U/L   Total Bilirubin 0.3 0.3 - 1.2 mg/dL   GFR calc non Af Amer 87 (L) >90 mL/min   GFR calc Af Amer >90 >90 mL/min   Anion gap 5 5 - 15  Lactate dehydrogenase     Status: None   Collection Time: 02/19/15  9:50 PM  Result Value Ref Range   LDH 187 94 - 250 U/L  Uric acid     Status: None   Collection Time: 02/19/15  9:50 PM  Result Value Ref Range   Uric Acid, Serum 6.0 2.4 - 7.0 mg/dL    Per Dr Stefano Gaulstringer, monitor BP x 4 hour is still elevated start Mag 4gm bolus then 2 gm maintenance

## 2015-02-20 NOTE — Progress Notes (Signed)
Notified OB of patient's elevated BP's from 0200-0500. Patient will be transferred to L&D and placed on Mag.

## 2015-02-20 NOTE — Plan of Care (Signed)
Problem: Phase II Progression Outcomes Goal: Afebrile, VS remain stable Outcome: Completed/Met Date Met:  02/20/15 Pt on magnesium for elevated blood pressures. Discussed signs and symptoms of preeclampsia to watch for such as heachache that is worsening, blurred vision, seeing spots, right sided severe pain.

## 2015-02-20 NOTE — Plan of Care (Signed)
Problem: Consults Goal: Postpartum Patient Education (See Patient Education module for education specifics.)  Outcome: Progressing Pt given preeclampsia handout. Pt educated to keep calm, quiet environment at home and seek help as needed to keep blood pressure stable. Reviewed symptoms to call provider for or come to hospital for.Marland Kitchen.as listed on handout. Pt states understanding. Pt instructed to seek help from family members and get plenty of rest. Pt states she has FOB and an aunt that can help as needed.

## 2015-02-20 NOTE — Plan of Care (Signed)
Problem: Consults Goal: Postpartum Patient Education (See Patient Education module for education specifics.)  Outcome: Progressing Reviewed self care and baby care with patient using Baby and Me Book as guide. Pt interactive and teach back method used.

## 2015-02-20 NOTE — Progress Notes (Signed)
Consulted with Dr. Stefano GaulStringer regarding BP readings since delivery. Filed Vitals:   02/20/15 1603 02/20/15 1646 02/20/15 1652 02/20/15 1716  BP:  146/98  140/95  Pulse: 76 78  73  Temp:  98.4 F (36.9 C)    TempSrc:  Oral    Resp:  18  16  Height:      Weight:   140 lb 3.2 oz (63.594 kg)   SpO2: 100% 97%  97%   BP range this afternoon 136-150/93-99  Magnesium started at 0624.  Will continue magnesium until patient re-evaluated on rounds tomorrow am. AICU staff notified.    Nigel BridgemanVicki Clinton Wahlberg, CNM 02/20/15 6:45p

## 2015-02-20 NOTE — Progress Notes (Signed)
Reviewed b/p's with provider, orders recieved

## 2015-02-21 LAB — COMPREHENSIVE METABOLIC PANEL
ALBUMIN: 2.6 g/dL — AB (ref 3.5–5.2)
ALT: 19 U/L (ref 0–35)
ANION GAP: 7 (ref 5–15)
AST: 45 U/L — AB (ref 0–37)
Alkaline Phosphatase: 152 U/L — ABNORMAL HIGH (ref 39–117)
BUN: 6 mg/dL (ref 6–23)
CALCIUM: 6.8 mg/dL — AB (ref 8.4–10.5)
CO2: 27 mmol/L (ref 19–32)
Chloride: 103 mmol/L (ref 96–112)
Creatinine, Ser: 0.88 mg/dL (ref 0.50–1.10)
GFR calc non Af Amer: >90 mL/min (ref >90)
GLUCOSE: 118 mg/dL — AB (ref 70–99)
Potassium: 3.7 mmol/L (ref 3.5–5.1)
SODIUM: 137 mmol/L (ref 135–145)
Total Bilirubin: 0.2 mg/dL — ABNORMAL LOW (ref 0.3–1.2)
Total Protein: 6 g/dL (ref 6.0–8.3)

## 2015-02-21 LAB — CBC
HCT: 31.4 % — ABNORMAL LOW (ref 36.0–46.0)
Hemoglobin: 10.5 g/dL — ABNORMAL LOW (ref 12.0–15.0)
MCH: 31 pg (ref 26.0–34.0)
MCHC: 33.4 g/dL (ref 30.0–36.0)
MCV: 92.6 fL (ref 78.0–100.0)
Platelets: 180 10*3/uL (ref 150–400)
RBC: 3.39 MIL/uL — AB (ref 3.87–5.11)
RDW: 15.5 % (ref 11.5–15.5)
WBC: 5.4 10*3/uL (ref 4.0–10.5)

## 2015-02-21 NOTE — Lactation Note (Signed)
This note was copied from the chart of Crystal Santiago. Lactation Consultation Note Initial visit done.  Breastfeeding consultation services/support information given to mom.  She states baby has been latching well and she also supplements with formula and a few mls of colostrum.  Volume parameters given to mom for supplementation.  Assisted with DEBP.  Colostrum easily hand expressed.  Instructed to continue to put baby to breast with feeding cues followed by pumping for 15 minutes every 3 hours.  Supplement after breastfeeding with volume per day of life.  Encouraged to call for assist/concerns.  Patient Name: Crystal Santiago WUJWJ'XToday's Date: 02/21/2015 Reason for consult: Initial assessment;Infant < 6lbs   Maternal Data    Feeding    LATCH Score/Interventions                      Lactation Tools Discussed/Used Pump Review: Setup, frequency, and cleaning;Milk Storage Initiated by:: RN Date initiated:: 02/21/15   Consult Status Consult Status: Follow-up Date: 02/22/15 Follow-up type: In-patient    Huston FoleyMOULDEN, Makensie Mulhall S 02/21/2015, 12:02 PM

## 2015-02-21 NOTE — Progress Notes (Signed)
Horald ChestnutVicky Latham, CNM notified of patients blood pressures.  No orders received and will continue to monitor.

## 2015-02-21 NOTE — Progress Notes (Addendum)
Post Partum Day 2  Subjective:  no complaints, tolerating PO and No headaches.  Objective:  Blood pressure 131/83, pulse 66, temperature 98 F (36.7 C), temperature source Oral, resp. rate 18, height 5\' 6"  (1.676 m), weight 140 lb 3.2 oz (63.594 kg), SpO2 99 %, unknown if currently breastfeeding.  Physical Exam:   General: alert and no distress Lochia: appropriate Uterine Fundus: firm Incision: NA DVT Evaluation: No evidence of DVT seen on physical exam.   Recent Labs  02/19/15 1630 02/20/15 0425  HGB 11.0* 10.5*  HCT 33.9* 31.8*   Results for orders placed or performed during the hospital encounter of 02/19/15 (from the past 24 hour(s))  MRSA PCR Screening     Status: None   Collection Time: 02/20/15  5:00 PM  Result Value Ref Range   MRSA by PCR NEGATIVE NEGATIVE    Assessment/Plan:  Preeclampsia: Now treated with 24 hours of magnesium. Discontinue magnesium. Transfer to the postpartum floor from the intensive care unit. Check labs this morning.  Plan for discharge tomorrow, Breastfeeding and Contraception Depo-Provera.   LOS: 2 days   Merl Guardino V 02/21/2015, 9:28 AM

## 2015-02-22 ENCOUNTER — Inpatient Hospital Stay (HOSPITAL_COMMUNITY): Admission: RE | Admit: 2015-02-22 | Payer: Medicaid Other | Source: Ambulatory Visit

## 2015-02-22 ENCOUNTER — Ambulatory Visit: Payer: Self-pay

## 2015-02-22 MED ORDER — IBUPROFEN 600 MG PO TABS: 600.0000 mg | ORAL_TABLET | Freq: Four times a day (QID) | ORAL | Status: DC | PRN

## 2015-02-22 MED ORDER — LABETALOL HCL 200 MG PO TABS: 200.0000 mg | ORAL_TABLET | Freq: Two times a day (BID) | ORAL | Status: DC

## 2015-02-22 MED ORDER — LABETALOL HCL 200 MG PO TABS
200.0000 mg | ORAL_TABLET | Freq: Two times a day (BID) | ORAL | Status: DC
  Administered 2015-02-22: 200 mg via ORAL
  Filled 2015-02-22: qty 1

## 2015-02-22 MED ORDER — MEDROXYPROGESTERONE ACETATE 150 MG/ML IM SUSP: 150.0000 mg | INTRAMUSCULAR | Status: DC

## 2015-02-22 NOTE — Discharge Instructions (Signed)
Postpartum Care After Vaginal Delivery °After you deliver your newborn (postpartum period), the usual stay in the hospital is 24-72 hours. If there were problems with your labor or delivery, or if you have other medical problems, you might be in the hospital longer.  °While you are in the hospital, you will receive help and instructions on how to care for yourself and your newborn during the postpartum period.  °While you are in the hospital: °· Be sure to tell your nurses if you have pain or discomfort, as well as where you feel the pain and what makes the pain worse. °· If you had an incision made near your vagina (episiotomy) or if you had some tearing during delivery, the nurses may put ice packs on your episiotomy or tear. The ice packs may help to reduce the pain and swelling. °· If you are breastfeeding, you may feel uncomfortable contractions of your uterus for a couple of weeks. This is normal. The contractions help your uterus get back to normal size. °· It is normal to have some bleeding after delivery. °· For the first 1-3 days after delivery, the flow is red and the amount may be similar to a period. °· It is common for the flow to start and stop. °· In the first few days, you may pass some small clots. Let your nurses know if you begin to pass large clots or your flow increases. °· Do not  flush blood clots down the toilet before having the nurse look at them. °· During the next 3-10 days after delivery, your flow should become more watery and pink or brown-tinged in color. °· Ten to fourteen days after delivery, your flow should be a small amount of yellowish-white discharge. °· The amount of your flow will decrease over the first few weeks after delivery. Your flow may stop in 6-8 weeks. Most women have had their flow stop by 12 weeks after delivery. °· You should change your sanitary pads frequently. °· Wash your hands thoroughly with soap and water for at least 20 seconds after changing pads, using  the toilet, or before holding or feeding your newborn. °· You should feel like you need to empty your bladder within the first 6-8 hours after delivery. °· In case you become weak, lightheaded, or faint, call your nurse before you get out of bed for the first time and before you take a shower for the first time. °· Within the first few days after delivery, your breasts may begin to feel tender and full. This is called engorgement. Breast tenderness usually goes away within 48-72 hours after engorgement occurs. You may also notice milk leaking from your breasts. If you are not breastfeeding, do not stimulate your breasts. Breast stimulation can make your breasts produce more milk. °· Spending as much time as possible with your newborn is very important. During this time, you and your newborn can feel close and get to know each other. Having your newborn stay in your room (rooming in) will help to strengthen the bond with your newborn.  It will give you time to get to know your newborn and become comfortable caring for your newborn. °· Your hormones change after delivery. Sometimes the hormone changes can temporarily cause you to feel sad or tearful. These feelings should not last more than a few days. If these feelings last longer than that, you should talk to your caregiver. °· If desired, talk to your caregiver about methods of family planning or contraception. °·   Talk to your caregiver about immunizations. Your caregiver may want you to have the following immunizations before leaving the hospital:  Tetanus, diphtheria, and pertussis (Tdap) or tetanus and diphtheria (Td) immunization. It is very important that you and your family (including grandparents) or others caring for your newborn are up-to-date with the Tdap or Td immunizations. The Tdap or Td immunization can help protect your newborn from getting ill.  Rubella immunization.  Varicella (chickenpox) immunization.  Influenza immunization. You should  receive this annual immunization if you did not receive the immunization during your pregnancy. Document Released: 09/10/2007 Document Revised: 08/07/2012 Document Reviewed: 07/10/2012 [facility] Patient Information 2015 [place]. This information is not intended to replace advice given to you by your health care provider. Make sure you discuss any questions you have with your health care provider.  Preeclampsia and Eclampsia Preeclampsia is a serious condition that develops only during pregnancy. It is also called toxemia of pregnancy. This condition causes high blood pressure along with other symptoms, such as swelling and headaches. These may develop as the condition gets worse. Preeclampsia may occur 20 weeks or later into your pregnancy.  Diagnosing and treating preeclampsia early is very important. If not treated early, it can cause serious problems for you and your baby. One problem it can lead to is eclampsia, which is a condition that causes muscle jerking or shaking (convulsions) in the mother. Delivering your baby is the best treatment for preeclampsia or eclampsia.  RISK FACTORS The cause of preeclampsia is not known. You may be more likely to develop preeclampsia if you have certain risk factors. These include:   Being pregnant for the first time.  Having preeclampsia in a past pregnancy.  Having a family history of preeclampsia.  Having high blood pressure.  Being pregnant with twins or triplets.  Being 3935 or older.  Being African American.  Having kidney disease or diabetes.  Having medical conditions such as lupus or blood diseases.  Being very overweight (obese). SIGNS AND SYMPTOMS  The earliest signs of preeclampsia are:  High blood pressure.  Increased protein in your urine. Your health care provider will check for this at every prenatal visit. Other symptoms that can develop include:   Severe headaches.  Sudden weight gain.  Swelling of your hands,  face, legs, and feet.  Feeling sick to your stomach (nauseous) and throwing up (vomiting).  Vision problems (blurred or double vision).  Numbness in your face, arms, legs, and feet.  Dizziness.  Slurred speech.  Sensitivity to bright lights.  Abdominal pain. DIAGNOSIS  There are no screening tests for preeclampsia. Your health care provider will ask you about symptoms and check for signs of preeclampsia during your prenatal visits. You may also have tests, including:  Urine testing.  Blood testing.  Checking your baby's heart rate.  Checking the health of your baby and your placenta using images created with sound waves (ultrasound). TREATMENT  You can work out the best treatment approach together with your health care provider. It is very important to keep all prenatal appointments. If you have an increased risk of preeclampsia, you may need more frequent prenatal exams.  Your health care provider may prescribe bed rest.  You may have to eat as little salt as possible.  You may need to take medicine to lower your blood pressure if the condition does not respond to more conservative measures.  You may need to stay in the hospital if your condition is severe. There, treatment will focus  on controlling your blood pressure and fluid retention. You may also need to take medicine to prevent seizures.  If the condition gets worse, your baby may need to be delivered early to protect you and the baby. You may have your labor started with medicine (be induced), or you may have a cesarean delivery.  Preeclampsia usually goes away after the baby is born, but your blood pressure may still be elevated for a while after delivery. HOME CARE INSTRUCTIONS   Only take over-the-counter or prescription medicines as directed by your health care provider.  Lie on your left side while resting. This keeps pressure off your baby.  Elevate your feet while resting.  Get regular exercise. Ask your  health care provider what type of exercise is safe for you.  Avoid caffeine and alcohol.  Do not smoke.  Drink 6-8 glasses of water every day.  Eat a balanced diet that is low in salt. Do not add salt to your food.  Avoid stressful situations as much as possible.  Get plenty of rest and sleep.  Keep all prenatal appointments and tests as scheduled. SEEK MEDICAL CARE IF:  You are gaining more weight than expected.  You have any headaches, abdominal pain, or nausea.  You are bruising more than usual.  You feel dizzy or light-headed. SEEK IMMEDIATE MEDICAL CARE IF:   You develop sudden or severe swelling anywhere in your body. This usually happens in the legs.  You gain 5 lb (2.3 kg) or more in a week.  You have a severe headache, dizziness, problems with your vision, or confusion.  You have severe abdominal pain.  You have lasting nausea or vomiting.  You have a seizure.  You have trouble moving any part of your body.  You develop numbness in your body.  You have trouble speaking.  You have any abnormal bleeding.  You develop a stiff neck.  You pass out. MAKE SURE YOU:   Understand these instructions.  Will watch your condition.  Will get help right away if you are not doing well or get worse. Document Released: 11/10/2000 Document Revised: 11/18/2013 Document Reviewed: 09/05/2013 [facility] Patient Information 2015 [place], [place]. This information is not intended to replace advice given to you by your health care provider. Make sure you discuss any questions you have with your health care provider.

## 2015-02-22 NOTE — Lactation Note (Signed)
This note was copied from the chart of Crystal Santiago. Lactation Consultation Note  Patient Name: Crystal Santiago ZOXWR'UToday's Date: 02/22/2015 Reason for consult: Follow-up assessment;Infant < 6lbs Mom reports baby is doing well at the breast. She is pumping and with last pumping received 15 ml of colostrum. Mom is supplementing with formula as well. Baby weight loss is 3%, adequate I/O. Praised Mom for good work. Mom has own DEBP she is using. Encouraged Mom to continue plan. BF 8-12 times in 24 hours and with feeding ques. Advised to BF for up to 30 minutes to minimize calorie usage at the breast. Continue to post pump and supplement with minimum of 15 ml each feeding. Engorgement care reviewed if needed. Encouraged to call for questions/concerns and assist with latch.   Maternal Data    Feeding Feeding Type: Breast Fed Length of feed: 10 min  LATCH Score/Interventions Latch: Grasps breast easily, tongue down, lips flanged, rhythmical sucking.  Audible Swallowing: A few with stimulation Intervention(s): Hand expression  Type of Nipple: Everted at rest and after stimulation  Comfort (Breast/Nipple): Soft / non-tender     Hold (Positioning): No assistance needed to correctly position infant at breast.  LATCH Score: 9  Lactation Tools Discussed/Used Tools: Pump Breast pump type: Double-Electric Breast Pump   Consult Status Consult Status: Follow-up Date: 02/23/15 Follow-up type: In-patient    Alfred LevinsGranger, Donaldo Teegarden Ann 02/22/2015, 12:02 PM

## 2015-02-22 NOTE — Progress Notes (Signed)
UR chart review completed.  

## 2015-02-23 ENCOUNTER — Ambulatory Visit: Payer: Self-pay

## 2015-02-23 NOTE — Lactation Note (Signed)
This note was copied from the chart of Crystal Lorrene ReidJustina Santiago. Lactation Consultation Note: mom has baby latched to breast when I went into room. Mom reports she has been feeding for 30 min. Reports breasts are feeling heavier and now softer since baby has nursed. Reports stool has changed in color to yellow mustard. Has personal pump and is going to pump now. No questions at present. Reviewed our phone number to call with questions/concerns and OP appointments and BFSG as resources after DC. To call prn  Patient Name: Crystal Santiago UJWJX'BToday's Date: 02/23/2015 Reason for consult: Follow-up assessment;Late preterm infant   Maternal Data Formula Feeding for Exclusion: No Has patient been taught Hand Expression?: Yes Does the patient have breastfeeding experience prior to this delivery?: No  Feeding Feeding Type: Breast Fed Length of feed: 30 min  LATCH Score/Interventions Latch: Grasps breast easily, tongue down, lips flanged, rhythmical sucking.  Audible Swallowing: A few with stimulation  Type of Nipple: Everted at rest and after stimulation  Comfort (Breast/Nipple): Soft / non-tender     Hold (Positioning): No assistance needed to correctly position infant at breast.  LATCH Score: 9  Lactation Tools Discussed/Used     Consult Status Consult Status: Complete    Pamelia HoitWeeks, Owenn Rothermel D 02/23/2015, 8:17 AM

## 2015-11-28 NOTE — L&D Delivery Note (Signed)
25 y.o. G3P1011 at 3641w5d delivered a viable  infant in cephalic, OA position. No nuchal cord. Anterior shoulder delivered with ease. 60 sec delayed cord clamping. Cord clamped x2 and cut. Placenta delivered spontaneously intact, with 3VC. Fundus firm on exam with massage and pitocin. Good hemostasis noted.  Laceration: None Suture: N/A Good hemostasis noted. EBL: 100cc  Mom and baby recovering in LDR.    Apgars: 8,9 Weight: pending   Freddrick MarchYashika Amin, MD PGY-1 11/06/2016, 3:48 AM

## 2016-06-12 ENCOUNTER — Encounter: Payer: Self-pay | Admitting: Obstetrics & Gynecology

## 2016-06-12 ENCOUNTER — Ambulatory Visit (INDEPENDENT_AMBULATORY_CARE_PROVIDER_SITE_OTHER): Payer: Medicaid Other | Admitting: Obstetrics & Gynecology

## 2016-06-12 VITALS — BP 108/67 | HR 92 | Wt 122.0 lb

## 2016-06-12 DIAGNOSIS — Z3689 Encounter for other specified antenatal screening: Secondary | ICD-10-CM

## 2016-06-12 DIAGNOSIS — Z3492 Encounter for supervision of normal pregnancy, unspecified, second trimester: Secondary | ICD-10-CM

## 2016-06-12 DIAGNOSIS — O09292 Supervision of pregnancy with other poor reproductive or obstetric history, second trimester: Secondary | ICD-10-CM | POA: Diagnosis not present

## 2016-06-12 DIAGNOSIS — O26892 Other specified pregnancy related conditions, second trimester: Secondary | ICD-10-CM

## 2016-06-12 DIAGNOSIS — R87610 Atypical squamous cells of undetermined significance on cytologic smear of cervix (ASC-US): Secondary | ICD-10-CM | POA: Insufficient documentation

## 2016-06-12 DIAGNOSIS — O0990 Supervision of high risk pregnancy, unspecified, unspecified trimester: Secondary | ICD-10-CM | POA: Insufficient documentation

## 2016-06-12 DIAGNOSIS — R51 Headache: Secondary | ICD-10-CM

## 2016-06-12 DIAGNOSIS — R87810 Cervical high risk human papillomavirus (HPV) DNA test positive: Secondary | ICD-10-CM

## 2016-06-12 DIAGNOSIS — O26899 Other specified pregnancy related conditions, unspecified trimester: Secondary | ICD-10-CM | POA: Insufficient documentation

## 2016-06-12 DIAGNOSIS — O09299 Supervision of pregnancy with other poor reproductive or obstetric history, unspecified trimester: Secondary | ICD-10-CM | POA: Insufficient documentation

## 2016-06-12 DIAGNOSIS — R519 Headache, unspecified: Secondary | ICD-10-CM | POA: Insufficient documentation

## 2016-06-12 MED ORDER — ASPIRIN EC 81 MG PO TBEC: 81.0000 mg | DELAYED_RELEASE_TABLET | Freq: Every day | ORAL | Status: AC

## 2016-06-12 MED ORDER — BUTALBITAL-APAP-CAFFEINE 50-325-40 MG PO CAPS: 1.0000 | ORAL_CAPSULE | Freq: Four times a day (QID) | ORAL | Status: DC | PRN

## 2016-06-12 NOTE — Progress Notes (Signed)
Patient states that she has migraines about 5 out of 7 days. Crystal StammerJennifer Kalon Erhardt RN BSN

## 2016-06-12 NOTE — Progress Notes (Signed)
Subjective:    Crystal Santiago is a 25 y.o. G3P1011 at [redacted]w[redacted]d by LMP being seen today for her first obstetrical visit.  Her obstetrical history is significant for preeclampsia during last pregnancy, had IUGR and vaginally delivered an infant that weight 4 lb 8.8 oz. Patient does intend to breast feed. Pregnancy history fully reviewed.  Patient reports having migraines occasionally. Not alleviated by Tylenol.  Filed Vitals:   06/12/16 1405  BP: 108/67  Pulse: 92  Weight: 122 lb (55.339 kg)    HISTORY: OB History  Gravida Para Term Preterm AB SAB TAB Ectopic Multiple Living  0 1    # Outcome Date GA Lbr Len/2nd Weight Sex Delivery Anes PTL Lv  3 Current           2 Term 02/19/15 [redacted]w[redacted]d 00:05 / 00:18 4 lb 8.8 oz (2.064 kg) F Vag-Spont Local  Y     Complications: IUGR (intrauterine growth restriction),Preeclampsia, third trimester  1 SAB              Past Medical History  Diagnosis Date  . Anemia   . Hypertension in pregnancy, preeclampsia     IUGR   Past Surgical History  Procedure Laterality Date  . No past surgeries     Family History  Problem Relation Age of Onset  . Alcohol abuse Father   . Bipolar disorder Maternal Aunt   . Hypertension Maternal Grandmother   . Diabetes Maternal Grandmother   . Diabetes Maternal Grandfather   . Hypertension Paternal Grandmother   . Diabetes Paternal Grandmother   . Diabetes Paternal Grandfather      Exam    Uterus:  Fundal Height: 18 cm  FHR 137  Pelvic Exam:    Perineum: No Hemorrhoids, Normal Perineum   Vulva: normal   Vagina:  normal mucosa, normal discharge   Cervix: multiparous appearance, no bleeding following Pap, no cervical motion tenderness and no lesions   Adnexa: normal adnexa, no mass, fullness, tenderness and normal ovaries palpated bilaterally   Bony Pelvis: gynecoid  System: Breast:  normal appearance, no masses or tenderness   Skin: normal coloration and turgor, no rashes   Neurologic:  oriented, normal, negative, normal mood   Extremities: normal strength, tone, and muscle mass   HEENT PERRLA, extra ocular movement intact and sclera clear, anicteric   Mouth/Teeth mucous membranes moist, pharynx normal without lesions and dental hygiene good   Neck supple and no masses   Cardiovascular: regular rate and rhythm   Respiratory:  appears well, vitals normal, no respiratory distress, acyanotic, normal RR, chest clear, no wheezing, crepitations, rhonchi, normal symmetric air entry   Abdomen: soft, non-tender; bowel sounds normal; no masses,  no organomegaly   Urinary: urethral meatus normal      Assessment:    Pregnancy: [id number] Patient Active Problem List   Diagnosis Date Noted  . History of preeclampsia and IUGR in prior pregnancy, currently pregnant 06/12/2016  . Supervision of normal pregnancy 06/12/2016  . Headache in pregnancy 06/12/2016      Plan:   Initial labs drawn. Continue prenatal vitamins. Aspirin 81 mg ordered for preeclampsia prevention. Fioricet ordered as needed for migraines, will continue to monitor Problem list reviewed and updated. Genetic Screening discussed Quad Screen: ordered. Ultrasound discussed; fetal survey: ordered. The nature of [facility] with multiple MDs and other Advanced Practice Providers was explained to patient; also emphasized that residents, students  are part of our team. Follow up in 4 weeks. Routine obstetric precautions reviewed.  Tereso NewcomerANYANWU,Reynald Woods A, MD 06/12/2016

## 2016-06-12 NOTE — Patient Instructions (Signed)
Second Trimester of Pregnancy The second trimester is from week 13 through week 28, months 4 through 6. The second trimester is often a time when you feel your best. Your body has also adjusted to being pregnant, and you begin to feel better physically. Usually, morning sickness has lessened or quit completely, you may have more energy, and you may have an increase in appetite. The second trimester is also a time when the fetus is growing rapidly. At the end of the sixth month, the fetus is about 9 inches long and weighs about 1 pounds. You will likely begin to feel the baby move (quickening) between 18 and 20 weeks of the pregnancy. BODY CHANGES Your body goes through many changes during pregnancy. The changes vary from woman to woman.   Your weight will continue to increase. You will notice your lower abdomen bulging out.  You may begin to get stretch marks on your hips, abdomen, and breasts.  You may develop headaches that can be relieved by medicines approved by your health care provider.  You may urinate more often because the fetus is pressing on your bladder.  You may develop or continue to have heartburn as a result of your pregnancy.  You may develop constipation because certain hormones are causing the muscles that push waste through your intestines to slow down.  You may develop hemorrhoids or swollen, bulging veins (varicose veins).  You may have back pain because of the weight gain and pregnancy hormones relaxing your joints between the bones in your pelvis and as a result of a shift in weight and the muscles that support your balance.  Your breasts will continue to grow and be tender.  Your gums may bleed and may be sensitive to brushing and flossing.  Dark spots or blotches (chloasma, mask of pregnancy) may develop on your face. This will likely fade after the baby is born.  A dark line from your belly button to the pubic area (linea nigra) may appear. This will likely  fade after the baby is born.  You may have changes in your hair. These can include thickening of your hair, rapid growth, and changes in texture. Some women also have hair loss during or after pregnancy, or hair that feels dry or thin. Your hair will most likely return to normal after your baby is born. WHAT TO EXPECT AT YOUR PRENATAL VISITS During a routine prenatal visit:  You will be weighed to make sure you and the fetus are growing normally.  Your blood pressure will be taken.  Your abdomen will be measured to track your baby's growth.  The fetal heartbeat will be listened to.  Any test results from the previous visit will be discussed. Your health care provider may ask you:  How you are feeling.  If you are feeling the baby move.  If you have had any abnormal symptoms, such as leaking fluid, bleeding, severe headaches, or abdominal cramping.  If you are using any tobacco products, including cigarettes, chewing tobacco, and electronic cigarettes.  If you have any questions. Other tests that may be performed during your second trimester include:  Blood tests that check for:  Low iron levels (anemia).  Gestational diabetes (between 24 and 28 weeks).  Rh antibodies.  Urine tests to check for infections, diabetes, or protein in the urine.  An ultrasound to confirm the proper growth and development of the baby.  An amniocentesis to check for possible genetic problems.  Fetal screens for spina bifida   and Down syndrome.  HIV (human immunodeficiency virus) testing. Routine prenatal testing includes screening for HIV, unless you choose not to have this test. HOME CARE INSTRUCTIONS   Avoid all smoking, herbs, alcohol, and unprescribed drugs. These chemicals affect the formation and growth of the baby.  Do not use any tobacco products, including cigarettes, chewing tobacco, and electronic cigarettes. If you need help quitting, ask your health care provider. You may receive  counseling support and other resources to help you quit.  Follow your health care provider's instructions regarding medicine use. There are medicines that are either safe or unsafe to take during pregnancy.  Exercise only as directed by your health care provider. Experiencing uterine cramps is a good sign to stop exercising.  Continue to eat regular, healthy meals.  Wear a good support bra for breast tenderness.  Do not use hot tubs, steam rooms, or saunas.  Wear your seat belt at all times when driving.  Avoid raw meat, uncooked cheese, cat litter boxes, and soil used by cats. These carry germs that can cause birth defects in the baby.  Take your prenatal vitamins.  Take 1500-2000 mg of calcium daily starting at the 20th week of pregnancy until you deliver your baby.  Try taking a stool softener (if your health care provider approves) if you develop constipation. Eat more high-fiber foods, such as fresh vegetables or fruit and whole grains. Drink plenty of fluids to keep your urine clear or pale yellow.  Take warm sitz baths to soothe any pain or discomfort caused by hemorrhoids. Use hemorrhoid cream if your health care provider approves.  If you develop varicose veins, wear support hose. Elevate your feet for 15 minutes, 3-4 times a day. Limit salt in your diet.  Avoid heavy lifting, wear low heel shoes, and practice good posture.  Rest with your legs elevated if you have leg cramps or low back pain.  Visit your dentist if you have not gone yet during your pregnancy. Use a soft toothbrush to brush your teeth and be gentle when you floss.  A sexual relationship may be continued unless your health care provider directs you otherwise.  Continue to go to all your prenatal visits as directed by your health care provider. SEEK MEDICAL CARE IF:   You have dizziness.  You have mild pelvic cramps, pelvic pressure, or nagging pain in the abdominal area.  You have persistent nausea,  vomiting, or diarrhea.  You have a bad smelling vaginal discharge.  You have pain with urination. SEEK IMMEDIATE MEDICAL CARE IF:   You have a fever.  You are leaking fluid from your vagina.  You have spotting or bleeding from your vagina.  You have severe abdominal cramping or pain.  You have rapid weight gain or loss.  You have shortness of breath with chest pain.  You notice sudden or extreme swelling of your face, hands, ankles, feet, or legs.  You have not felt your baby move in over an hour.  You have severe headaches that do not go away with medicine.  You have vision changes.   This information is not intended to replace advice given to you by your health care provider. Make sure you discuss any questions you have with your health care provider.   Document Released: 11/07/2001 Document Revised: 12/04/2014 Document Reviewed: 01/14/2013 Elsevier Interactive Patient Education 2016 [employer] Inc[employer]   Breastfeeding Deciding to breastfeed is one of the best choices you can make for you and your baby. A change   in hormones during pregnancy causes your breast tissue to grow and increases the number and size of your milk ducts. These hormones also allow proteins, sugars, and fats from your blood supply to make breast milk in your milk-producing glands. Hormones prevent breast milk from being released before your baby is born as well as prompt milk flow after birth. Once breastfeeding has begun, thoughts of your baby, as well as his or her sucking or crying, can stimulate the release of milk from your milk-producing glands.  BENEFITS OF BREASTFEEDING For Your Baby  Your first milk (colostrum) helps your baby's digestive system function better.  There are antibodies in your milk that help your baby fight off infections.  Your baby has a lower incidence of asthma, allergies, and sudden infant death syndrome.  The nutrients in breast milk are better for your baby than infant  formulas and are designed uniquely for your baby's needs.  Breast milk improves your baby's brain development.  Your baby is less likely to develop other conditions, such as childhood obesity, asthma, or type 2 diabetes mellitus. For You  Breastfeeding helps to create a very special bond between you and your baby.  Breastfeeding is convenient. Breast milk is always available at the correct temperature and costs nothing.  Breastfeeding helps to burn calories and helps you lose the weight gained during pregnancy.  Breastfeeding makes your uterus contract to its prepregnancy size faster and slows bleeding (lochia) after you give birth.   Breastfeeding helps to lower your risk of developing type 2 diabetes mellitus, osteoporosis, and breast or ovarian cancer later in life. SIGNS THAT YOUR BABY IS HUNGRY Early Signs of Hunger  Increased alertness or activity.  Stretching.  Movement of the head from side to side.  Movement of the head and opening of the mouth when the corner of the mouth or cheek is stroked (rooting).  Increased sucking sounds, smacking lips, cooing, sighing, or squeaking.  Hand-to-mouth movements.  Increased sucking of fingers or hands. Late Signs of Hunger  Fussing.  Intermittent crying. Extreme Signs of Hunger Signs of extreme hunger will require calming and consoling before your baby will be able to breastfeed successfully. Do not wait for the following signs of extreme hunger to occur before you initiate breastfeeding:  Restlessness.  A loud, strong cry.  Screaming. BREASTFEEDING BASICS Breastfeeding Initiation  Find a comfortable place to sit or lie down, with your neck and back well supported.  Place a pillow or rolled up blanket under your baby to bring him or her to the level of your breast (if you are seated). Nursing pillows are specially designed to help support your arms and your baby while you breastfeed.  Make sure that your baby's  abdomen is facing your abdomen.  Gently massage your breast. With your fingertips, massage from your chest wall toward your nipple in a circular motion. This encourages milk flow. You may need to continue this action during the feeding if your milk flows slowly.  Support your breast with 4 fingers underneath and your thumb above your nipple. Make sure your fingers are well away from your nipple and your baby's mouth.  Stroke your baby's lips gently with your finger or nipple.  When your baby's mouth is open wide enough, quickly bring your baby to your breast, placing your entire nipple and as much of the colored area around your nipple (areola) as possible into your baby's mouth.  More areola should be visible above your baby's upper lip than   below the lower lip.  Your baby's tongue should be between his or her lower gum and your breast.  Ensure that your baby's mouth is correctly positioned around your nipple (latched). Your baby's lips should create a seal on your breast and be turned out (everted).  It is common for your baby to suck about 2-3 minutes in order to start the flow of breast milk. Latching Teaching your baby how to latch on to your breast properly is very important. An improper latch can cause nipple pain and decreased milk supply for you and poor weight gain in your baby. Also, if your baby is not latched onto your nipple properly, he or she may swallow some air during feeding. This can make your baby fussy. Burping your baby when you switch breasts during the feeding can help to get rid of the air. However, teaching your baby to latch on properly is still the best way to prevent fussiness from swallowing air while breastfeeding. Signs that your baby has successfully latched on to your nipple:  Silent tugging or silent sucking, without causing you pain.  Swallowing heard between every 3-4 sucks.  Muscle movement above and in front of his or her ears while sucking. Signs  that your baby has not successfully latched on to nipple:  Sucking sounds or smacking sounds from your baby while breastfeeding.  Nipple pain. If you think your baby has not latched on correctly, slip your finger into the corner of your baby's mouth to break the suction and place it between your baby's gums. Attempt breastfeeding initiation again. Signs of Successful Breastfeeding Signs from your baby:  A gradual decrease in the number of sucks or complete cessation of sucking.  Falling asleep.  Relaxation of his or her body.  Retention of a small amount of milk in his or her mouth.  Letting go of your breast by himself or herself. Signs from you:  Breasts that have increased in firmness, weight, and size 1-3 hours after feeding.  Breasts that are softer immediately after breastfeeding.  Increased milk volume, as well as a change in milk consistency and color by the fifth day of breastfeeding.  Nipples that are not sore, cracked, or bleeding. Signs That Your Baby is Getting Enough Milk  Wetting at least 3 diapers in a 24-hour period. The urine should be clear and pale yellow by age 5 days.  At least 3 stools in a 24-hour period by age 5 days. The stool should be soft and yellow.  At least 3 stools in a 24-hour period by age 7 days. The stool should be seedy and yellow.  No loss of weight greater than 10% of birth weight during the first 3 days of age.  Average weight gain of 4-7 ounces (113-198 g) per week after age 4 days.  Consistent daily weight gain by age 5 days, without weight loss after the age of 2 weeks. After a feeding, your baby may spit up a small amount. This is common. BREASTFEEDING FREQUENCY AND DURATION Frequent feeding will help you make more milk and can prevent sore nipples and breast engorgement. Breastfeed when you feel the need to reduce the fullness of your breasts or when your baby shows signs of hunger. This is called "breastfeeding on demand." Avoid  introducing a pacifier to your baby while you are working to establish breastfeeding (the first 4-6 weeks after your baby is born). After this time you may choose to use a pacifier. Research has shown that   pacifier use during the first year of a baby's life decreases the risk of sudden infant death syndrome (SIDS). Allow your baby to feed on each breast as long as he or she wants. Breastfeed until your baby is finished feeding. When your baby unlatches or falls asleep while feeding from the first breast, offer the second breast. Because newborns are often sleepy in the first few weeks of life, you may need to awaken your baby to get him or her to feed. Breastfeeding times will vary from baby to baby. However, the following rules can serve as a guide to help you ensure that your baby is properly fed:  Newborns (babies 4 weeks of age or younger) may breastfeed every 1-3 hours.  Newborns should not go longer than 3 hours during the day or 5 hours during the night without breastfeeding.  You should breastfeed your baby a minimum of 8 times in a 24-hour period until you begin to introduce solid foods to your baby at around 6 months of age. BREAST MILK PUMPING Pumping and storing breast milk allows you to ensure that your baby is exclusively fed your breast milk, even at times when you are unable to breastfeed. This is especially important if you are going back to work while you are still breastfeeding or when you are not able to be present during feedings. Your lactation consultant can give you guidelines on how long it is safe to store breast milk. A breast pump is a machine that allows you to pump milk from your breast into a sterile bottle. The pumped breast milk can then be stored in a refrigerator or freezer. Some breast pumps are operated by hand, while others use electricity. Ask your lactation consultant which type will work best for you. Breast pumps can be purchased, but some hospitals and  breastfeeding support groups lease breast pumps on a monthly basis. A lactation consultant can teach you how to hand express breast milk, if you prefer not to use a pump. CARING FOR YOUR BREASTS WHILE YOU BREASTFEED Nipples can become dry, cracked, and sore while breastfeeding. The following recommendations can help keep your breasts moisturized and healthy:  Avoid using soap on your nipples.  Wear a supportive bra. Although not required, special nursing bras and tank tops are designed to allow access to your breasts for breastfeeding without taking off your entire bra or top. Avoid wearing underwire-style bras or extremely tight bras.  Air dry your nipples for 3-4minutes after each feeding.  Use only cotton bra pads to absorb leaked breast milk. Leaking of breast milk between feedings is normal.  Use lanolin on your nipples after breastfeeding. Lanolin helps to maintain your skin's normal moisture barrier. If you use pure lanolin, you do not need to wash it off before feeding your baby again. Pure lanolin is not toxic to your baby. You may also hand express a few drops of breast milk and gently massage that milk into your nipples and allow the milk to air dry. In the first few weeks after giving birth, some women experience extremely full breasts (engorgement). Engorgement can make your breasts feel heavy, warm, and tender to the touch. Engorgement peaks within 3-5 days after you give birth. The following recommendations can help ease engorgement:  Completely empty your breasts while breastfeeding or pumping. You may want to start by applying warm, moist heat (in the shower or with warm water-soaked hand towels) just before feeding or pumping. This increases circulation and helps the milk   flow. If your baby does not completely empty your breasts while breastfeeding, pump any extra milk after he or she is finished.  Wear a snug bra (nursing or regular) or tank top for 1-2 days to signal your body  to slightly decrease milk production.  Apply ice packs to your breasts, unless this is too uncomfortable for you.  Make sure that your baby is latched on and positioned properly while breastfeeding. If engorgement persists after 48 hours of following these recommendations, contact your health care provider or a lactation consultant. OVERALL HEALTH CARE RECOMMENDATIONS WHILE BREASTFEEDING  Eat healthy foods. Alternate between meals and snacks, eating 3 of each per day. Because what you eat affects your breast milk, some of the foods may make your baby more irritable than usual. Avoid eating these foods if you are sure that they are negatively affecting your baby.  Drink milk, fruit juice, and water to satisfy your thirst (about 10 glasses a day).  Rest often, relax, and continue to take your prenatal vitamins to prevent fatigue, stress, and anemia.  Continue breast self-awareness checks.  Avoid chewing and smoking tobacco. Chemicals from cigarettes that pass into breast milk and exposure to secondhand smoke may harm your baby.  Avoid alcohol and drug use, including marijuana. Some medicines that may be harmful to your baby can pass through breast milk. It is important to ask your health care provider before taking any medicine, including all over-the-counter and prescription medicine as well as vitamin and herbal supplements. It is possible to become pregnant while breastfeeding. If birth control is desired, ask your health care provider about options that will be safe for your baby. SEEK MEDICAL CARE IF:  You feel like you want to stop breastfeeding or have become frustrated with breastfeeding.  You have painful breasts or nipples.  Your nipples are cracked or bleeding.  Your breasts are red, tender, or warm.  You have a swollen area on either breast.  You have a fever or chills.  You have nausea or vomiting.  You have drainage other than breast milk from your nipples.  Your  breasts do not become full before feedings by the fifth day after you give birth.  You feel sad and depressed.  Your baby is too sleepy to eat well.  Your baby is having trouble sleeping.   Your baby is wetting less than 3 diapers in a 24-hour period.  Your baby has less than 3 stools in a 24-hour period.  Your baby's skin or the white part of his or her eyes becomes yellow.   Your baby is not gaining weight by 5 days of age. SEEK IMMEDIATE MEDICAL CARE IF:  Your baby is overly tired (lethargic) and does not want to wake up and feed.  Your baby develops an unexplained fever.   This information is not intended to replace advice given to you by your health care provider. Make sure you discuss any questions you have with your health care provider.   Document Released: 11/13/2005 Document Revised: 08/04/2015 Document Reviewed: 05/07/2013 Elsevier Interactive Patient Education 2016 [employer] Inc.  

## 2016-06-14 LAB — CULTURE, OB URINE

## 2016-06-14 LAB — URINE CULTURE, OB REFLEX

## 2016-06-14 LAB — GC/CHLAMYDIA PROBE AMP
Chlamydia trachomatis, NAA: NEGATIVE
NEISSERIA GONORRHOEAE BY PCR: NEGATIVE

## 2016-06-17 ENCOUNTER — Encounter: Payer: Self-pay | Admitting: Obstetrics & Gynecology

## 2016-06-17 LAB — PAP IG W/ RFLX HPV ASCU: PAP Smear Comment: 0

## 2016-06-17 LAB — HPV DNA PROBE HIGH RISK, AMPLIFIED: HPV, high-risk: POSITIVE — AB

## 2016-06-19 ENCOUNTER — Telehealth: Payer: Self-pay

## 2016-06-19 NOTE — Telephone Encounter (Signed)
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   Will repeat cotesting in one year as per ASCCP guidelines. Please call to inform patient of results and recommendations.

## 2016-06-19 NOTE — Telephone Encounter (Signed)
Left message for patient to return call about pap smear. Will repeat in one year. Armandina Stammer RN BSN

## 2016-06-21 LAB — AFP, QUAD SCREEN
DIA MOM VALUE: 1.91
DIA VALUE (EIA): 378.67 pg/mL
DSR (By Age)    1 IN: 1044
DSR (SECOND TRIMESTER) 1 IN: 945
GESTATIONAL AGE AFP: 17.7 wk
MSAFP Mom: 0.97
MSAFP: 46 ng/mL
MSHCG Mom: 1.01
MSHCG: 34488 m[IU]/mL
Maternal Age At EDD: 24.7 YEARS
Osb Risk: 10000
PDF: 0
T18 (By Age): 1:4066 {titer}
TEST RESULTS AFP: NEGATIVE
Weight: 122 [lb_av]
uE3 Mom: 0.73
uE3 Value: 0.97 ng/mL

## 2016-06-21 LAB — PRENATAL PROFILE I(LABCORP)
Antibody Screen: NEGATIVE
Basophils Absolute: 0 10*3/uL (ref 0.0–0.2)
Basos: 0 %
EOS (ABSOLUTE): 0 10*3/uL (ref 0.0–0.4)
EOS: 1 %
HEP B S AG: NEGATIVE
Hematocrit: 35.2 % (ref 34.0–46.6)
Hemoglobin: 11.7 g/dL (ref 11.1–15.9)
IMMATURE GRANS (ABS): 0 10*3/uL (ref 0.0–0.1)
IMMATURE GRANULOCYTES: 0 %
Lymphocytes Absolute: 1.5 10*3/uL (ref 0.7–3.1)
Lymphs: 28 %
MCH: 29.8 pg (ref 26.6–33.0)
MCHC: 33.2 g/dL (ref 31.5–35.7)
MCV: 90 fL (ref 79–97)
MONOCYTES: 5 %
Monocytes Absolute: 0.3 10*3/uL (ref 0.1–0.9)
NEUTROS ABS: 3.6 10*3/uL (ref 1.4–7.0)
Neutrophils: 66 %
Platelets: 247 10*3/uL (ref 150–379)
RBC: 3.92 x10E6/uL (ref 3.77–5.28)
RDW: 14.6 % (ref 12.3–15.4)
RH TYPE: POSITIVE
RPR Ser Ql: NONREACTIVE
Rubella Antibodies, IGG: 4.88 index (ref >0.99)
WBC: 5.5 10*3/uL (ref 3.4–10.8)

## 2016-06-21 LAB — COMPREHENSIVE METABOLIC PANEL WITH GFR
ALT: 7 IU/L (ref 0–32)
AST: 11 IU/L (ref 0–40)
Albumin/Globulin Ratio: 1.1 — ABNORMAL LOW (ref 1.2–2.2)
Albumin: 3.7 g/dL (ref 3.5–5.5)
Alkaline Phosphatase: 53 IU/L (ref 39–117)
BUN/Creatinine Ratio: 13 (ref 9–23)
BUN: 8 mg/dL (ref 6–20)
Bilirubin Total: 0.4 mg/dL (ref 0.0–1.2)
CO2: 23 mmol/L (ref 18–29)
Calcium: 8.6 mg/dL — ABNORMAL LOW (ref 8.7–10.2)
Chloride: 99 mmol/L (ref 96–106)
Creatinine, Ser: 0.63 mg/dL (ref 0.57–1.00)
GFR calc Af Amer: 145 mL/min/1.73
GFR calc non Af Amer: 126 mL/min/1.73
Globulin, Total: 3.3 g/dL (ref 1.5–4.5)
Glucose: 94 mg/dL (ref 65–99)
Potassium: 3.9 mmol/L (ref 3.5–5.2)
Sodium: 137 mmol/L (ref 134–144)
Total Protein: 7 g/dL (ref 6.0–8.5)

## 2016-06-21 LAB — PROTEIN / CREATININE RATIO, URINE
CREATININE, UR: 114.3 mg/dL
Protein, Ur: 12.7 mg/dL
Protein/Creat Ratio: 111 mg/g creat (ref 0–200)

## 2016-06-21 LAB — HEMOGLOBINOPATHY EVALUATION
HGB C: 0 %
HGB S: 0 %
Hemoglobin A2 Quantitation: 2.4 % (ref 0.7–3.1)
Hemoglobin F Quantitation: 0 % (ref 0.0–2.0)
Hgb A: 97.6 % (ref 94.0–98.0)

## 2016-06-21 LAB — TOXASSURE SELECT 13 (MW), URINE: PDF: 0

## 2016-06-21 LAB — HIV ANTIBODY (ROUTINE TESTING W REFLEX): HIV Screen 4th Generation wRfx: NONREACTIVE

## 2016-07-04 ENCOUNTER — Ambulatory Visit (INDEPENDENT_AMBULATORY_CARE_PROVIDER_SITE_OTHER): Payer: Medicaid Other

## 2016-07-04 ENCOUNTER — Ambulatory Visit (INDEPENDENT_AMBULATORY_CARE_PROVIDER_SITE_OTHER): Payer: Medicaid Other | Admitting: Obstetrics & Gynecology

## 2016-07-04 VITALS — BP 115/69 | HR 86 | Wt 129.0 lb

## 2016-07-04 DIAGNOSIS — Z3492 Encounter for supervision of normal pregnancy, unspecified, second trimester: Secondary | ICD-10-CM | POA: Diagnosis not present

## 2016-07-04 DIAGNOSIS — Z1389 Encounter for screening for other disorder: Secondary | ICD-10-CM | POA: Diagnosis not present

## 2016-07-04 DIAGNOSIS — Z36 Encounter for antenatal screening of mother: Secondary | ICD-10-CM

## 2016-07-04 DIAGNOSIS — Z331 Pregnant state, incidental: Secondary | ICD-10-CM

## 2016-07-04 DIAGNOSIS — Z3689 Encounter for other specified antenatal screening: Secondary | ICD-10-CM

## 2016-07-04 LAB — POCT URINALYSIS DIPSTICK
Bilirubin, UA: NEGATIVE
Blood, UA: NEGATIVE
Glucose, UA: NEGATIVE
Ketones, UA: NEGATIVE
Leukocytes, UA: NEGATIVE
Nitrite, UA: NEGATIVE
PH UA: 7
Protein, UA: NEGATIVE
SPEC GRAV UA: 1.01
UROBILINOGEN UA: 0.2

## 2016-07-04 NOTE — Addendum Note (Signed)
Addended by: Lear NgMARTIN, Rakin Lemelle L on: 07/04/2016 03:13 PM   Modules accepted: Orders

## 2016-07-04 NOTE — Progress Notes (Signed)
Subjective:  Crystal PercyJustina L Santiago is a 25 y.o. G3P1011 at 8923w6d being seen today for ongoing prenatal care.  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  Patient reports no complaints.  Contractions: Not present. Vag. Bleeding: None.  Movement: Present. Denies leaking of fluid.   The following portions of the patient's history were reviewed and updated as appropriate: allergies, current medications, past family history, past medical history, past social history, past surgical history and problem list. Problem list updated.  Objective:   Vitals:   07/04/16 1353  BP: 115/69  Pulse: 86  Weight: 129 lb (58.5 kg)    Fetal Status: Fetal Heart Rate (bpm): 143 Fundal Height: 20 cm Movement: Present     General:  Alert, oriented and cooperative. Patient is in no acute distress.  Skin: Skin is warm and dry. No rash noted.   Cardiovascular: Normal heart rate noted  Respiratory: Normal respiratory effort, no problems with respiration noted  Abdomen: Soft, gravid, appropriate for gestational age. Pain/Pressure: Present     Pelvic:  Cervical exam deferred        Extremities: Normal range of motion.  Edema: None  Mental Status: Normal mood and affect. Normal behavior. Normal judgment and thought content.   Urinalysis: Urine Protein: Negative Urine Glucose: Negative  Assessment and Plan:  Pregnancy: G3P1011 at 3423w6d  1. Supervision of normal pregnancy, second trimester Normal anatomy scan today.  Preterm labor symptoms and general obstetric precautions including but not limited to vaginal bleeding, contractions, leaking of fluid and fetal movement were reviewed in detail with the patient. Please refer to After Visit Summary for other counseling  recommendations.  Return in about 4 weeks (around 08/01/2016) for OB Visit.   Tereso NewcomerUgonna A Shalla Bulluck, MD

## 2016-07-04 NOTE — Patient Instructions (Signed)
Return to clinic for any scheduled appointments or obstetric concerns, or go to [facility] for evaluation  

## 2016-07-06 ENCOUNTER — Other Ambulatory Visit: Payer: Medicaid Other

## 2016-08-01 ENCOUNTER — Ambulatory Visit (INDEPENDENT_AMBULATORY_CARE_PROVIDER_SITE_OTHER): Payer: Medicaid Other | Admitting: Obstetrics & Gynecology

## 2016-08-01 VITALS — BP 116/77 | HR 86 | Wt 125.0 lb

## 2016-08-01 DIAGNOSIS — Z3492 Encounter for supervision of normal pregnancy, unspecified, second trimester: Secondary | ICD-10-CM | POA: Diagnosis not present

## 2016-08-01 DIAGNOSIS — O09292 Supervision of pregnancy with other poor reproductive or obstetric history, second trimester: Secondary | ICD-10-CM | POA: Diagnosis not present

## 2016-08-01 MED ORDER — VITAFOL GUMMIES 3.33-0.333-34.8 MG PO CHEW: 3.0000 | CHEWABLE_TABLET | Freq: Every day | ORAL | 8 refills | Status: AC

## 2016-08-01 NOTE — Patient Instructions (Signed)
Td Vaccine (Tetanus and Diphtheria): What You Need to Know 1. Why get vaccinated? Tetanus  and diphtheria are very serious diseases. They are rare in the [place] today, but people who do become infected often have severe complications. Td vaccine is used to protect adolescents and adults from both of these diseases. Both tetanus and diphtheria are infections caused by bacteria. Diphtheria spreads from person to person through coughing or sneezing. Tetanus-causing bacteria enter the body through cuts, scratches, or wounds. TETANUS (Lockjaw) causes painful muscle tightening and stiffness, usually all over the body.  It can lead to tightening of muscles in the head and neck so you can't open your mouth, swallow, or sometimes even breathe. Tetanus kills about 1 out of every 10 people who are infected even after receiving the best medical care. DIPHTHERIA can cause a thick coating to form in the back of the throat.  It can lead to breathing problems, paralysis, heart failure, and death. Before vaccines, as many as 200,000 cases of diphtheria and hundreds of cases of tetanus were reported in the [place] each year. Since vaccination began, reports of cases for both diseases have dropped by about 99%. 2. Td vaccine Td vaccine can protect adolescents and adults from tetanus and diphtheria. Td is usually given as a booster dose every 10 years but it can also be given earlier after a severe and dirty wound or burn. Another vaccine, called Tdap, which protects against pertussis in addition to tetanus and diphtheria, is sometimes recommended instead of Td vaccine. Your doctor or the person giving you the vaccine can give you more information. Td may safely be given at the same time as other vaccines. 3. Some people should not get this vaccine  A person who has ever had a life-threatening allergic reaction after a previous dose of any tetanus or diphtheria containing vaccine, OR has a severe allergy  to any part of this vaccine, should not get Td vaccine. Tell the person giving the vaccine about any severe allergies.  Talk to your doctor if you:  have seizures or another nervous system problem,  had severe pain or swelling after any vaccine containing diphtheria or tetanus,  ever had a condition called Guillain Barre Syndrome (GBS),  aren't feeling well on the day the shot is scheduled. 4. Risks of a vaccine reaction With any medicine, including vaccines, there is a chance of side effects. These are usually mild and go away on their own. Serious reactions are also possible but are rare. Most people who get Td vaccine do not have any problems with it. Mild Problems  following Td vaccine: (Did not interfere with activities)  Pain where the shot was given (about 8 people in 10)  Redness or swelling where the shot was given (about 1 person in 4)  Mild fever (rare)  Headache (about 1 person in 4)  Tiredness (about 1 person in 4) Moderate Problems following Td vaccine: (Interfered with activities, but did not require medical attention)  Fever over 102F (rare) Severe Problems  following Td vaccine: (Unable to perform usual activities; required medical attention)  Swelling, severe pain, bleeding and/or redness in the arm where the shot was given (rare). Problems that could happen after any vaccine:  People sometimes faint after a medical procedure, including vaccination. Sitting or lying down for about 15 minutes can help prevent fainting, and injuries caused by a fall. Tell your doctor if you feel dizzy, or have vision changes or ringing in the ears.  Some people get severe pain in the shoulder and have difficulty moving the arm where a shot was given. This happens very rarely.  Any medication can cause a severe allergic reaction. Such reactions from a vaccine are very rare, estimated at fewer than 1 in a million doses, and would happen within a few minutes to a few hours after  the vaccination. As with any medicine, there is a very remote chance of a vaccine causing a serious injury or death. The safety of vaccines is always being monitored. For more information, visit: http://floyd.org/www.cdc.gov/vaccinesafety/ 5. What if there is a serious reaction? What should I look for?  Look for anything that concerns you, such as signs of a severe allergic reaction, very high fever, or unusual behavior. Signs of a severe allergic reaction can include hives, swelling of the face and throat, difficulty breathing, a fast heartbeat, dizziness, and weakness. These would usually start a few minutes to a few hours after the vaccination. What should I do?  If you think it is a severe allergic reaction or other emergency that can't wait, call 9-1-1 or get the person to the nearest hospital. Otherwise, call your doctor.  Afterward, the reaction should be reported to the Vaccine Adverse Event Reporting System (VAERS). Your doctor might file this report, or you can do it yourself through the VAERS web site at www.vaers.LAgents.nohhs.gov, or by calling 1-(740)744-6072. VAERS does not give medical advice. 6. The National Vaccine Injury Compensation Program The [employer] Vaccine Injury Compensation Program (VICP) is a federal program that was created to compensate people who may have been injured by certain vaccines. Persons who believe they may have been injured by a vaccine can learn about the program and about filing a claim by calling 1-631-856-2260 or visiting the VICP website at SpiritualWord.atwww.hrsa.gov/vaccinecompensation. There is a time limit to file a claim for compensation. 7. How can I learn more?  Ask your doctor. He or she can give you the vaccine package insert or suggest other sources of information.  Call your local or state health department.  Contact the Centers for Disease Control and Prevention (CDC):  Call (403)440-39701-226 886 5776 (1-800-CDC-INFO)  Visit CDC's website at PicCapture.uywww.cdc.gov/vaccines CDC Td Vaccine VIS  (01/20/14)   This information is not intended to replace advice given to you by your health care provider. Make sure you discuss any questions you have with your health care provider.   Influenza (Flu) Vaccine (Inactivated or Recombinant):  1. Why get vaccinated? Influenza ("flu") is a contagious disease that spreads around the [place] every year, usually between October and May. Flu is caused by influenza viruses, and is spread mainly by coughing, sneezing, and close contact. Anyone can get flu. Flu strikes suddenly and can last several days. Symptoms vary by age, but can include:  fever/chills  sore throat  muscle aches  fatigue  cough  headache  runny or stuffy nose Flu can also lead to pneumonia and blood infections, and cause diarrhea and seizures in children. If you have a medical condition, such as heart or lung disease, flu can make it worse. Flu is more dangerous for some people. Infants and young children, people 24 years of age and older, pregnant women, and people with certain health conditions or a weakened immune system are at greatest risk. Each year thousands of people in the [place] die from flu, and many more are hospitalized. Flu vaccine can:  keep you from getting flu,  make flu less severe if you do get it,  and  keep you from spreading flu to your family and other people. 2. Inactivated and recombinant flu vaccines A dose of flu vaccine is recommended every flu season. Children 6 months through 44 years of age may need two doses during the same flu season. Everyone else needs only one dose each flu season. Some inactivated flu vaccines contain a very small amount of a mercury-based preservative called thimerosal. Studies have not shown thimerosal in vaccines to be harmful, but flu vaccines that do not contain thimerosal are available. There is no live flu virus in flu shots. They cannot cause the flu. There are many flu viruses, and they are always  changing. Each year a new flu vaccine is made to protect against three or four viruses that are likely to cause disease in the upcoming flu season. But even when the vaccine doesn't exactly match these viruses, it may still provide some protection. Flu vaccine cannot prevent:  flu that is caused by a virus not covered by the vaccine, or  illnesses that look like flu but are not. It takes about 2 weeks for protection to develop after vaccination, and protection lasts through the flu season. 3. Some people should not get this vaccine Tell the person who is giving you the vaccine:  If you have any severe, life-threatening allergies. If you ever had a life-threatening allergic reaction after a dose of flu vaccine, or have a severe allergy to any part of this vaccine, you may be advised not to get vaccinated. Most, but not all, types of flu vaccine contain a small amount of egg protein.  If you ever had Guillain-Barre Syndrome (also called GBS). Some people with a history of GBS should not get this vaccine. This should be discussed with your doctor.  If you are not feeling well. It is usually okay to get flu vaccine when you have a mild illness, but you might be asked to come back when you feel better. 4. Risks of a vaccine reaction With any medicine, including vaccines, there is a chance of reactions. These are usually mild and go away on their own, but serious reactions are also possible. Most people who get a flu shot do not have any problems with it. Minor problems following a flu shot include:  soreness, redness, or swelling where the shot was given  hoarseness  sore, red or itchy eyes  cough  fever  aches  headache  itching  fatigue If these problems occur, they usually begin soon after the shot and last 1 or 2 days. More serious problems following a flu shot can include the following:  There may be a small increased risk of Guillain-Barre Syndrome (GBS) after inactivated flu  vaccine. This risk has been estimated at 1 or 2 additional cases per million people vaccinated. This is much lower than the risk of severe complications from flu, which can be prevented by flu vaccine.  Young children who get the flu shot along with pneumococcal vaccine (PCV13) and/or DTaP vaccine at the same time might be slightly more likely to have a seizure caused by fever. Ask your doctor for more information. Tell your doctor if a child who is getting flu vaccine has ever had a seizure. Problems that could happen after any injected vaccine:  People sometimes faint after a medical procedure, including vaccination. Sitting or lying down for about 15 minutes can help prevent fainting, and injuries caused by a fall. Tell your doctor if you feel dizzy, or have vision  changes or ringing in the ears.  Some people get severe pain in the shoulder and have difficulty moving the arm where a shot was given. This happens very rarely.  Any medication can cause a severe allergic reaction. Such reactions from a vaccine are very rare, estimated at about 1 in a million doses, and would happen within a few minutes to a few hours after the vaccination. As with any medicine, there is a very remote chance of a vaccine causing a serious injury or death. The safety of vaccines is always being monitored. For more information, visit: http://floyd.org/ 5. What if there is a serious reaction? What should I look for?  Look for anything that concerns you, such as signs of a severe allergic reaction, very high fever, or unusual behavior. Signs of a severe allergic reaction can include hives, swelling of the face and throat, difficulty breathing, a fast heartbeat, dizziness, and weakness. These would start a few minutes to a few hours after the vaccination. What should I do?  If you think it is a severe allergic reaction or other emergency that can't wait, call 9-1-1 and get the person to the nearest hospital.  Otherwise, call your doctor.  Reactions should be reported to the Vaccine Adverse Event Reporting System (VAERS). Your doctor should file this report, or you can do it yourself through the VAERS web site at www.vaers.LAgents.no, or by calling 1-(647)850-1752. VAERS does not give medical advice. 6. The National Vaccine Injury Compensation Program The [employer] Vaccine Injury Compensation Program (VICP) is a federal program that was created to compensate people who may have been injured by certain vaccines. Persons who believe they may have been injured by a vaccine can learn about the program and about filing a claim by calling 1-843 815 5389 or visiting the VICP website at SpiritualWord.at. There is a time limit to file a claim for compensation. 7. How can I learn more?  Ask your healthcare provider. He or she can give you the vaccine package insert or suggest other sources of information.  Call your local or state health department.  Contact the Centers for Disease Control and Prevention (CDC):  Call 561-071-6837 (1-800-CDC-INFO) or  Visit CDC's website at BiotechRoom.com.cy Vaccine Information Statement Inactivated Influenza Vaccine (07/03/2014)   This information is not intended to replace advice given to you by your health care provider. Make sure you discuss any questions you have with your health care provider.   Document Released: 09/07/2006 Document Revised: 12/04/2014 Document Reviewed: 07/06/2014 Elsevier Interactive Patient Education 2016 Elsevier Inc.    Document Released: 09/10/2006 Document Revised: 12/04/2014 Document Reviewed: 02/25/2014 Elsevier Interactive Patient Education [employer]

## 2016-08-01 NOTE — Progress Notes (Signed)
   PRENATAL VISIT NOTE  Subjective:  Crystal Santiago is a 25 y.o. G3P1011 at 7476w6d being seen today for ongoing prenatal care.  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  Patient reports no complaints.  Contractions: Not present. Vag. Bleeding: None.  Movement: Present. Denies leaking of fluid.   The following portions of the patient's history were reviewed and updated as appropriate: allergies, current medications, past family history, past medical history, past social history, past surgical history and problem list. Problem list updated.  Objective:   Vitals:   08/01/16 1052  BP: 116/77  Pulse: 86  Weight: 125 lb (56.7 kg)    Fetal Status: Fetal Heart Rate (bpm): 148 Fundal Height: 24 cm Movement: Present     General:  Alert, oriented and cooperative. Patient is in no acute distress.  Skin: Skin is warm and dry. No rash noted.   Cardiovascular: Normal heart rate noted  Respiratory: Normal respiratory effort, no problems with respiration noted  Abdomen: Soft, gravid, appropriate for gestational age. Pain/Pressure: Present     Pelvic:  Cervical exam deferred        Extremities: Normal range of motion.  Edema: None  Mental Status: Normal mood and affect. Normal behavior. Normal judgment and thought content.   Urinalysis: Urine Protein: Negative Urine Glucose: Negative  Assessment and Plan:  Pregnancy: G3P1011 at 5576w6d  1. Hx of preeclampsia, prior pregnancy, currently pregnant, second trimester Taking ASA as prescribed  2. Supervision of normal pregnancy, second trimester Prenatal vitamins refilled Preterm labor symptoms and general obstetric precautions including but not limited to vaginal bleeding, contractions, leaking of  fluid and fetal movement were reviewed in detail with the patient. Please refer to After Visit Summary for other counseling recommendations.  Return in about 4 weeks (around 08/29/2016) for 2 hr GTT (needs to be fasting, schedule in morning) , 3rd trimester labs, TDap, Flu, OB Visit.  Tereso NewcomerUgonna A Anyanwu, MD"

## 2016-08-29 ENCOUNTER — Ambulatory Visit: Payer: Medicaid Other | Admitting: Obstetrics and Gynecology

## 2016-08-29 ENCOUNTER — Other Ambulatory Visit: Payer: Medicaid Other

## 2016-08-29 ENCOUNTER — Encounter: Payer: Self-pay | Admitting: *Deleted

## 2016-08-29 ENCOUNTER — Encounter: Payer: Self-pay | Admitting: Obstetrics and Gynecology

## 2016-08-29 VITALS — BP 112/65 | HR 66 | Temp 98.0°F | Wt 126.6 lb

## 2016-08-29 DIAGNOSIS — Z3402 Encounter for supervision of normal first pregnancy, second trimester: Secondary | ICD-10-CM

## 2016-08-29 DIAGNOSIS — Z23 Encounter for immunization: Secondary | ICD-10-CM | POA: Diagnosis not present

## 2016-08-29 DIAGNOSIS — O09299 Supervision of pregnancy with other poor reproductive or obstetric history, unspecified trimester: Secondary | ICD-10-CM

## 2016-08-29 NOTE — Progress Notes (Signed)
Subjective:  Crystal Santiago is a 25 y.o. G3P1011 at [redacted]w[redacted]d being seen today for ongoing prenatal care.  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  Patient reports no complaints.  Contractions: Irritability. Vag. Bleeding: None.  Movement: Present. Denies leaking of fluid.   The following portions of the patient's history were reviewed and updated as appropriate: allergies, current medications, past family history, past medical history, past social history, past surgical history and problem list. Problem list updated.  Objective:   Vitals:   08/29/16 1000  BP: 112/65  Pulse: 66  Temp: 98 F (36.7 C)  Weight: 126 lb 9.6 oz (57.4 kg)    Fetal Status:     Movement: Present     General:  Alert, oriented and cooperative. Patient is in no acute distress.  Skin: Skin is warm and dry. No rash noted.   Cardiovascular: Normal heart rate noted  Respiratory: Normal respiratory effort, no problems with respiration noted  Abdomen: Soft, gravid, appropriate for gestational age. Pain/Pressure: Present     Pelvic:  Cervical exam deferred        Extremities: Normal range of motion.  Edema: None  Mental Status: Normal mood and affect. Normal behavior. Normal judgment and thought content.   Urinalysis:      Assessment and Plan:  Pregnancy: G3P1011 at [redacted]w[redacted]d  1. History of preeclampsia and IUGR in prior pregnancy, currently pregnant BP stable, qd BASA  2. Encounter for supervision of normal first pregnancy in second trimester Flu vaccine - Glucose Tolerance, 2 Hours w/1 Hour - CBC - RPR - HIV antibody  Preterm labor symptoms and general obstetric precautions including but not limited to vaginal bleeding, contractions, leaking of fluid and fetal  movement were reviewed in detail with the patient. Please refer to After Visit Summary for other counseling recommendations.  No Follow-up on file.   Hermina StaggersMichael L Talon Witting, MD

## 2016-08-29 NOTE — Progress Notes (Signed)
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 Patient reports no bleeding.

## 2016-08-30 LAB — CBC
HEMATOCRIT: 36 % (ref 34.0–46.6)
Hemoglobin: 11.7 g/dL (ref 11.1–15.9)
MCH: 30 pg (ref 26.6–33.0)
MCHC: 32.5 g/dL (ref 31.5–35.7)
MCV: 92 fL (ref 79–97)
Platelets: 204 10*3/uL (ref 150–379)
RBC: 3.9 x10E6/uL (ref 3.77–5.28)
RDW: 14.7 % (ref 12.3–15.4)
WBC: 4.9 10*3/uL (ref 3.4–10.8)

## 2016-08-30 LAB — GLUCOSE TOLERANCE, 2 HOURS W/ 1HR
GLUCOSE, FASTING: 76 mg/dL (ref 65–91)
Glucose, 1 hour: 126 mg/dL (ref 65–179)
Glucose, 2 hour: 96 mg/dL (ref 65–152)

## 2016-08-30 LAB — HIV ANTIBODY (ROUTINE TESTING W REFLEX): HIV SCREEN 4TH GENERATION: NONREACTIVE

## 2016-08-30 LAB — RPR: RPR Ser Ql: NONREACTIVE

## 2016-09-12 ENCOUNTER — Encounter: Payer: Medicaid Other | Admitting: Obstetrics and Gynecology

## 2016-09-19 ENCOUNTER — Ambulatory Visit (INDEPENDENT_AMBULATORY_CARE_PROVIDER_SITE_OTHER): Payer: Medicaid Other | Admitting: Obstetrics & Gynecology

## 2016-09-19 VITALS — BP 106/68 | HR 76 | Temp 97.3°F | Wt 132.2 lb

## 2016-09-19 DIAGNOSIS — O0990 Supervision of high risk pregnancy, unspecified, unspecified trimester: Secondary | ICD-10-CM

## 2016-09-19 DIAGNOSIS — O09293 Supervision of pregnancy with other poor reproductive or obstetric history, third trimester: Secondary | ICD-10-CM

## 2016-09-19 DIAGNOSIS — O09299 Supervision of pregnancy with other poor reproductive or obstetric history, unspecified trimester: Secondary | ICD-10-CM

## 2016-09-19 DIAGNOSIS — Z3402 Encounter for supervision of normal first pregnancy, second trimester: Secondary | ICD-10-CM

## 2016-09-19 DIAGNOSIS — Z23 Encounter for immunization: Secondary | ICD-10-CM

## 2016-09-19 NOTE — Progress Notes (Signed)
   PRENATAL VISIT NOTE  Subjective:  Crystal Santiago is a 25 y.o. G3P1011 at 6329w6d being seen today for ongoing prenatal care.  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  Patient reports no complaints.  Contractions: Irritability. Vag. Bleeding: None.  Movement: Present. Denies leaking of fluid.   The following portions of the patient's history were reviewed and updated as appropriate: allergies, current medications, past family history, past medical history, past social history, past surgical history and problem list. Problem list updated.  Objective:   Vitals:   09/19/16 1502  BP: 106/68  Pulse: 76  Temp: 97.3 F (36.3 C)  Weight: 132 lb 3.2 oz (60 kg)    Fetal Status: Fetal Heart Rate (bpm): 124 Fundal Height: 31 cm Movement: Present     General:  Alert, oriented and cooperative. Patient is in no acute distress.  Skin: Skin is warm and dry. No rash noted.   Cardiovascular: Normal heart rate noted  Respiratory: Normal respiratory effort, no problems with respiration noted  Abdomen: Soft, gravid, appropriate for gestational age. Pain/Pressure: Absent     Pelvic:  Cervical exam deferred        Extremities: Normal range of motion.  Edema: None  Mental Status: Normal mood and affect. Normal behavior. Normal judgment and thought content.   Assessment and Plan:  Pregnancy: G3P1011 at 1229w6d  1. Encounter for supervision of normal first pregnancy in second trimester Need records of prenatal care - Tdap vaccine greater than or equal to 7yo IM  2. Supervision of high risk pregnancy, antepartum Growth US and start testing  3. History of preeclampsia and IUGR in prior pregnancy, currently pregnant   Preterm labor symptoms and general  obstetric precautions including but not limited to vaginal bleeding, contractions, leaking of fluid and fetal movement were reviewed in detail with the patient. Please refer to After Visit Summary for other counseling recommendations.  No Follow-up on file.  Adam PhenixJames G Arnold, MD

## 2016-09-19 NOTE — Progress Notes (Signed)
Patient is in the office and states that she is feeling good, reports good fetal movement.

## 2016-10-04 ENCOUNTER — Ambulatory Visit (INDEPENDENT_AMBULATORY_CARE_PROVIDER_SITE_OTHER): Payer: Medicaid Other | Admitting: Certified Nurse Midwife

## 2016-10-04 VITALS — BP 115/72 | HR 80 | Wt 133.0 lb

## 2016-10-04 DIAGNOSIS — O26843 Uterine size-date discrepancy, third trimester: Secondary | ICD-10-CM

## 2016-10-04 DIAGNOSIS — Z8759 Personal history of other complications of pregnancy, childbirth and the puerperium: Secondary | ICD-10-CM

## 2016-10-04 DIAGNOSIS — Z3483 Encounter for supervision of other normal pregnancy, third trimester: Secondary | ICD-10-CM

## 2016-10-04 NOTE — Progress Notes (Signed)
Pt states that she is not able to sleep, she may sleep 3 hours/night.

## 2016-10-04 NOTE — Patient Instructions (Addendum)
Preterm Labor Information Preterm labor is when labor starts at less than 37 weeks of pregnancy. The normal length of a pregnancy is 39 to 41 weeks. CAUSES Often, there is no identifiable underlying cause as to why a woman goes into preterm labor. One of the most common known causes of preterm labor is infection. Infections of the uterus, cervix, vagina, amniotic sac, bladder, kidney, or even the lungs (pneumonia) can cause labor to start. Other suspected causes of preterm labor include:   Urogenital infections, such as yeast infections and bacterial vaginosis.   Uterine abnormalities (uterine shape, uterine septum, fibroids, or bleeding from the placenta).   A cervix that has been operated on (it may fail to stay closed).   Malformations in the fetus.   Multiple gestations (twins, triplets, and so on).   Breakage of the amniotic sac.  RISK FACTORS  Having a previous history of preterm labor.   Having premature rupture of membranes (PROM).   Having a placenta that covers the opening of the cervix (placenta previa).   Having a placenta that separates from the uterus (placental abruption).   Having a cervix that is too weak to hold the fetus in the uterus (incompetent cervix).   Having too much fluid in the amniotic sac (polyhydramnios).   Taking illegal drugs or smoking while pregnant.   Not gaining enough weight while pregnant.   Being younger than 18 and older than 24 years old.   Having a low socioeconomic status.   Being African American. SYMPTOMS Signs and symptoms of preterm labor include:   Menstrual-like cramps, abdominal pain, or back pain.  Uterine contractions that are regular, as frequent as six in an hour, regardless of their intensity (may be mild or painful).  Contractions that start on the top of the uterus and spread down to the lower abdomen and back.   A sense of increased pelvic pressure.   A watery or bloody mucus discharge that  comes from the vagina.  TREATMENT Depending on the length of the pregnancy and other circumstances, your health care provider may suggest bed rest. If necessary, there are medicines that can be given to stop contractions and to mature the fetal lungs. If labor happens before 34 weeks of pregnancy, a prolonged hospital stay may be recommended. Treatment depends on the condition of both you and the fetus.  WHAT SHOULD YOU DO IF YOU THINK YOU ARE IN PRETERM LABOR? Call your health care provider right away. You will need to go to the hospital to get checked immediately. HOW CAN YOU PREVENT PRETERM LABOR IN FUTURE PREGNANCIES? You should:   Stop smoking if you smoke.  Maintain healthy weight gain and avoid chemicals and drugs that are not necessary.  Be watchful for any type of infection.  Inform your health care provider if you have a known history of preterm labor.   This information is not intended to replace advice given to you by your health care provider. Make sure you discuss any questions you have with your health care provider.   Document Released: 02/03/2004 Document Revised: 07/16/2013 Document Reviewed: 12/16/2012 Elsevier Interactive Patient Education 2016 [employer] Third Trimester of Pregnancy The third trimester is from week 29 through week 42, months 7 through 9. The third trimester is a time when the fetus is growing rapidly. At the end of the ninth month, the fetus is about 20 inches in length and weighs 6-10 pounds.  BODY CHANGES Your body goes through many changes during pregnancy.   The changes vary from woman to woman.   Your weight will continue to increase. You can expect to gain 25-35 pounds (11-16 kg) by the end of the pregnancy.  You may begin to get stretch marks on your hips, abdomen, and breasts.  You may urinate more often because the fetus is moving lower into your pelvis and pressing on your bladder.  You may develop or continue to have heartburn as a  result of your pregnancy.  You may develop constipation because certain hormones are causing the muscles that push waste through your intestines to slow down.  You may develop hemorrhoids or swollen, bulging veins (varicose veins).  You may have pelvic pain because of the weight gain and pregnancy hormones relaxing your joints between the bones in your pelvis. Backaches may result from overexertion of the muscles supporting your posture.  You may have changes in your hair. These can include thickening of your hair, rapid growth, and changes in texture. Some women also have hair loss during or after pregnancy, or hair that feels dry or thin. Your hair will most likely return to normal after your baby is born.  Your breasts will continue to grow and be tender. A yellow discharge may leak from your breasts called colostrum.  Your belly button may stick out.  You may feel short of breath because of your expanding uterus.  You may notice the fetus "dropping," or moving lower in your abdomen.  You may have a bloody mucus discharge. This usually occurs a few days to a week before labor begins.  Your cervix becomes thin and soft (effaced) near your due date. WHAT TO EXPECT AT YOUR PRENATAL EXAMS  You will have prenatal exams every 2 weeks until week 36. Then, you will have weekly prenatal exams. During a routine prenatal visit:  You will be weighed to make sure you and the fetus are growing normally.  Your blood pressure is taken.  Your abdomen will be measured to track your baby's growth.  The fetal heartbeat will be listened to.  Any test results from the previous visit will be discussed.  You may have a cervical check near your due date to see if you have effaced. At around 36 weeks, your caregiver will check your cervix. At the same time, your caregiver will also perform a test on the secretions of the vaginal tissue. This test is to determine if a type of bacteria, Group B  streptococcus, is present. Your caregiver will explain this further. Your caregiver may ask you:  What your birth plan is.  How you are feeling.  If you are feeling the baby move.  If you have had any abnormal symptoms, such as leaking fluid, bleeding, severe headaches, or abdominal cramping.  If you are using any tobacco products, including cigarettes, chewing tobacco, and electronic cigarettes.  If you have any questions. Other tests or screenings that may be performed during your third trimester include:  Blood tests that check for low iron levels (anemia).  Fetal testing to check the health, activity level, and growth of the fetus. Testing is done if you have certain medical conditions or if there are problems during the pregnancy.  HIV (human immunodeficiency virus) testing. If you are at high risk, you may be screened for HIV during your third trimester of pregnancy. FALSE LABOR You may feel small, irregular contractions that eventually go away. These are called Braxton Hicks contractions, or false labor. Contractions may last for hours, days, or even   weeks before true labor sets in. If contractions come at regular intervals, intensify, or become painful, it is best to be seen by your caregiver.  SIGNS OF LABOR   Menstrual-like cramps.  Contractions that are 5 minutes apart or less.  Contractions that start on the top of the uterus and spread down to the lower abdomen and back.  A sense of increased pelvic pressure or back pain.  A watery or bloody mucus discharge that comes from the vagina. If you have any of these signs before the 37th week of pregnancy, call your caregiver right away. You need to go to the hospital to get checked immediately. HOME CARE INSTRUCTIONS   Avoid all smoking, herbs, alcohol, and unprescribed drugs. These chemicals affect the formation and growth of the baby.  Do not use any tobacco products, including cigarettes, chewing tobacco, and  electronic cigarettes. If you need help quitting, ask your health care provider. You may receive counseling support and other resources to help you quit.  Follow your caregiver's instructions regarding medicine use. There are medicines that are either safe or unsafe to take during pregnancy.  Exercise only as directed by your caregiver. Experiencing uterine cramps is a good sign to stop exercising.  Continue to eat regular, healthy meals.  Wear a good support bra for breast tenderness.  Do not use hot tubs, steam rooms, or saunas.  Wear your seat belt at all times when driving.  Avoid raw meat, uncooked cheese, cat litter boxes, and soil used by cats. These carry germs that can cause birth defects in the baby.  Take your prenatal vitamins.  Take 1500-2000 mg of calcium daily starting at the 20th week of pregnancy until you deliver your baby.  Try taking a stool softener (if your caregiver approves) if you develop constipation. Eat more high-fiber foods, such as fresh vegetables or fruit and whole grains. Drink plenty of fluids to keep your urine clear or pale yellow.  Take warm sitz baths to soothe any pain or discomfort caused by hemorrhoids. Use hemorrhoid cream if your caregiver approves.  If you develop varicose veins, wear support hose. Elevate your feet for 15 minutes, 3-4 times a day. Limit salt in your diet.  Avoid heavy lifting, wear low heal shoes, and practice good posture.  Rest a lot with your legs elevated if you have leg cramps or low back pain.  Visit your dentist if you have not gone during your pregnancy. Use a soft toothbrush to brush your teeth and be gentle when you floss.  A sexual relationship may be continued unless your caregiver directs you otherwise.  Do not travel far distances unless it is absolutely necessary and only with the approval of your caregiver.  Take prenatal classes to understand, practice, and ask questions about the labor and  delivery.  Make a trial run to the hospital.  Pack your hospital bag.  Prepare the baby's nursery.  Continue to go to all your prenatal visits as directed by your caregiver. SEEK MEDICAL CARE IF:  You are unsure if you are in labor or if your water has broken.  You have dizziness.  You have mild pelvic cramps, pelvic pressure, or nagging pain in your abdominal area.  You have persistent nausea, vomiting, or diarrhea.  You have a bad smelling vaginal discharge.  You have pain with urination. SEEK IMMEDIATE MEDICAL CARE IF:   You have a fever.  You are leaking fluid from your vagina.  You have spotting or bleeding   from your vagina.  You have severe abdominal cramping or pain.  You have rapid weight loss or gain.  You have shortness of breath with chest pain.  You notice sudden or extreme swelling of your face, hands, ankles, feet, or legs.  You have not felt your baby move in over an hour.  You have severe headaches that do not go away with medicine.  You have vision changes.   This information is not intended to replace advice given to you by your health care provider. Make sure you discuss any questions you have with your health care provider.   Document Released: 11/07/2001 Document Revised: 12/04/2014 Document Reviewed: 01/14/2013 Elsevier Interactive Patient Education 2016 Elsevier Inc[employer]  

## 2016-10-04 NOTE — Progress Notes (Signed)
Subjective:    Crystal Santiago is a 25 y.o. female being seen today for her obstetrical visit. She is at 4187w0d gestation. Patient reports no complaints. Fetal movement: normal.  Reports trouble sleeping at night: has not tried anything for this.  Discussed bedtime hygiene, OTC Benadryl.    Problem List Items Addressed This Visit    None    Visit Diagnoses    History of prior pregnancy with IUGR newborn    -  Primary   Relevant Orders   AMB referral to maternal fetal medicine   US MFM OB DETAIL +14 WK   Uterine size date discrepancy pregnancy, third trimester       Relevant Orders   AMB referral to maternal fetal medicine   US MFM OB DETAIL +14 WK   Encounter for supervision of other normal pregnancy in third trimester       Relevant Orders   AMB referral to maternal fetal medicine     Patient Active Problem List   Diagnosis Date Noted  . History of preeclampsia and IUGR in prior pregnancy, currently pregnant 06/12/2016  . Supervision of high risk pregnancy, antepartum 06/12/2016  . 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 GBS planning TDAP offered  Rhogam given for RH negative Pediatrician: discussed. Infant feeding: plans to breastfeed. Maternity leave: discussed. Cigarette smoking: never smoked. Orders Placed This Encounter  Procedures  . US MFM OB DETAIL +14 WK    Standing Status:   Future    Standing Expiration Date:   12/04/2017    Order Specific Question:   Reason for Exam (SYMPTOM  OR DIAGNOSIS REQUIRED)    Answer:   S<D, hx of previous pregnancy IUGR    Order Specific Question:    Preferred imaging location?    Answer:   [facility]  . AMB referral to maternal fetal medicine    Referral Priority:   Routine    Referral Type:   Consultation    Referral Reason:   Specialty Services Required    Number of Visits Requested:   1   No orders of the defined types were placed in this encounter.  Follow up in 1 Week.

## 2016-10-10 ENCOUNTER — Ambulatory Visit (HOSPITAL_COMMUNITY): Payer: Medicaid Other

## 2016-10-10 ENCOUNTER — Ambulatory Visit (HOSPITAL_COMMUNITY): Admission: RE | Admit: 2016-10-10 | Payer: Medicaid Other | Source: Ambulatory Visit

## 2016-10-13 ENCOUNTER — Ambulatory Visit (INDEPENDENT_AMBULATORY_CARE_PROVIDER_SITE_OTHER): Payer: Medicaid Other | Admitting: Certified Nurse Midwife

## 2016-10-13 VITALS — BP 108/70 | HR 92 | Temp 97.0°F | Wt 137.6 lb

## 2016-10-13 DIAGNOSIS — O09293 Supervision of pregnancy with other poor reproductive or obstetric history, third trimester: Secondary | ICD-10-CM

## 2016-10-13 DIAGNOSIS — O0990 Supervision of high risk pregnancy, unspecified, unspecified trimester: Secondary | ICD-10-CM

## 2016-10-13 DIAGNOSIS — O09299 Supervision of pregnancy with other poor reproductive or obstetric history, unspecified trimester: Secondary | ICD-10-CM

## 2016-10-13 NOTE — Progress Notes (Signed)
Subjective:    Crystal Santiago is a 25 y.o. female being seen today for her obstetrical visit. She is at 586w2d gestation. Patient reports no complaints. Fetal movement: normal.  Problem List Items Addressed This Visit      Other   History of preeclampsia and IUGR in prior pregnancy, currently pregnant - Primary   Supervision of high risk pregnancy, antepartum   Relevant Orders   Strep Gp B NAA   NuSwab Vaginitis Plus (VG+)     Patient Active Problem List   Diagnosis Date Noted  . History of preeclampsia and IUGR in prior pregnancy, currently pregnant 06/12/2016  . Supervision of high risk pregnancy, antepartum 06/12/2016  . 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   Assessment:    Pregnancy @ 806w2d weeks   Plan:     labs reviewed, problem list updated Consent signed. GBS sent TDAP offered  Rhogam given for RH negative Pediatrician: discussed. Infant feeding: plans to breastfeed. Maternity leave: discussed. Cigarette smoking: never smoked. Orders Placed This Encounter  Procedures  . Strep Gp B NAA  . NuSwab Vaginitis Plus (VG+)   No orders of the defined types were placed in this encounter.  Follow up in 1 Week.

## 2016-10-13 NOTE — Progress Notes (Signed)
Patient states that she feels contractions sometimes but not often, reports good fetal movement.

## 2016-10-15 LAB — STREP GP B NAA: Strep Gp B NAA: POSITIVE — AB

## 2016-10-17 ENCOUNTER — Other Ambulatory Visit: Payer: Self-pay | Admitting: Certified Nurse Midwife

## 2016-10-17 ENCOUNTER — Encounter: Payer: Self-pay | Admitting: *Deleted

## 2016-10-17 DIAGNOSIS — O99820 Streptococcus B carrier state complicating pregnancy: Secondary | ICD-10-CM | POA: Insufficient documentation

## 2016-10-18 LAB — NUSWAB VAGINITIS PLUS (VG+)
Candida albicans, NAA: NEGATIVE
Candida glabrata, NAA: NEGATIVE
Chlamydia trachomatis, NAA: NEGATIVE
Neisseria gonorrhoeae, NAA: NEGATIVE
TRICH VAG BY NAA: NEGATIVE

## 2016-10-24 ENCOUNTER — Encounter (HOSPITAL_COMMUNITY): Payer: Self-pay | Admitting: Certified Nurse Midwife

## 2016-10-27 ENCOUNTER — Ambulatory Visit (INDEPENDENT_AMBULATORY_CARE_PROVIDER_SITE_OTHER): Payer: Medicaid Other | Admitting: Certified Nurse Midwife

## 2016-10-27 ENCOUNTER — Ambulatory Visit (HOSPITAL_COMMUNITY): Admission: RE | Admit: 2016-10-27 | Payer: Medicaid Other | Source: Ambulatory Visit

## 2016-10-27 ENCOUNTER — Other Ambulatory Visit: Payer: Self-pay | Admitting: Certified Nurse Midwife

## 2016-10-27 ENCOUNTER — Encounter (HOSPITAL_COMMUNITY): Payer: Self-pay

## 2016-10-27 ENCOUNTER — Ambulatory Visit (HOSPITAL_COMMUNITY)
Admission: RE | Admit: 2016-10-27 | Discharge: 2016-10-27 | Disposition: A | Discharge status: Home / Self Care | Payer: Medicaid Other | Source: Ambulatory Visit | Attending: Certified Nurse Midwife | Admitting: Certified Nurse Midwife

## 2016-10-27 VITALS — BP 120/79 | HR 87 | Wt 142.0 lb

## 2016-10-27 DIAGNOSIS — Z3A24 24 weeks gestation of pregnancy: Secondary | ICD-10-CM

## 2016-10-27 DIAGNOSIS — O26843 Uterine size-date discrepancy, third trimester: Secondary | ICD-10-CM

## 2016-10-27 DIAGNOSIS — O09299 Supervision of pregnancy with other poor reproductive or obstetric history, unspecified trimester: Secondary | ICD-10-CM

## 2016-10-27 DIAGNOSIS — O0990 Supervision of high risk pregnancy, unspecified, unspecified trimester: Secondary | ICD-10-CM

## 2016-10-27 DIAGNOSIS — Z3A37 37 weeks gestation of pregnancy: Secondary | ICD-10-CM | POA: Diagnosis not present

## 2016-10-27 DIAGNOSIS — Z8759 Personal history of other complications of pregnancy, childbirth and the puerperium: Secondary | ICD-10-CM | POA: Diagnosis present

## 2016-10-27 DIAGNOSIS — O10013 Pre-existing essential hypertension complicating pregnancy, third trimester: Secondary | ICD-10-CM | POA: Insufficient documentation

## 2016-10-27 DIAGNOSIS — O09293 Supervision of pregnancy with other poor reproductive or obstetric history, third trimester: Secondary | ICD-10-CM

## 2016-10-27 DIAGNOSIS — R87810 Cervical high risk human papillomavirus (HPV) DNA test positive: Secondary | ICD-10-CM

## 2016-10-27 DIAGNOSIS — R87610 Atypical squamous cells of undetermined significance on cytologic smear of cervix (ASC-US): Secondary | ICD-10-CM

## 2016-10-27 DIAGNOSIS — O99820 Streptococcus B carrier state complicating pregnancy: Secondary | ICD-10-CM

## 2016-10-27 NOTE — Progress Notes (Signed)
Subjective:    Crystal PercyJustina L Santiago is a 25 y.o. female being seen today for her obstetrical visit. She is at 7791w2d gestation. Patient reports no complaints. Fetal movement: normal.  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 GBS (group B Streptococcus carrier), +RV culture, currently pregnant 10/17/2016  . History of preeclampsia and IUGR in prior pregnancy, currently pregnant 06/12/2016  . Supervision of high risk pregnancy, antepartum 06/12/2016  . 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 Infant feeding: plans to breastfeed. Cigarette smoking: never smoked. L&D discussion: symptoms of labor, discussed when to call, discussed what number to call, anesthetic/analgesic options reviewed and delivering clinician:  plans no preference. Postpartum supports and preparation: circumcision discussed and contraception plans discussed.  Follow up in 1 Week.

## 2016-10-31 ENCOUNTER — Other Ambulatory Visit: Payer: Self-pay | Admitting: Certified Nurse Midwife

## 2016-10-31 DIAGNOSIS — O0990 Supervision of high risk pregnancy, unspecified, unspecified trimester: Secondary | ICD-10-CM

## 2016-11-03 ENCOUNTER — Ambulatory Visit (INDEPENDENT_AMBULATORY_CARE_PROVIDER_SITE_OTHER): Payer: Medicaid Other | Admitting: Obstetrics

## 2016-11-03 ENCOUNTER — Encounter: Payer: Self-pay | Admitting: Obstetrics

## 2016-11-03 VITALS — BP 113/66 | HR 88 | Temp 97.8°F | Wt 142.3 lb

## 2016-11-03 DIAGNOSIS — O0990 Supervision of high risk pregnancy, unspecified, unspecified trimester: Secondary | ICD-10-CM

## 2016-11-03 DIAGNOSIS — O0993 Supervision of high risk pregnancy, unspecified, third trimester: Secondary | ICD-10-CM

## 2016-11-03 NOTE — Progress Notes (Signed)
Patient c/o heartburn just started

## 2016-11-03 NOTE — Progress Notes (Signed)
Subjective:    Crystal Santiago is a 25 y.o. female being seen today for her obstetrical visit. She is at 6129w2d gestation. Patient reports heartburn. Fetal movement: normal.  Problem List Items Addressed This Visit    Supervision of high risk pregnancy, antepartum - Primary     Patient Active Problem List   Diagnosis Date Noted  . GBS (group B Streptococcus carrier), +RV culture, currently pregnant 10/17/2016  . History of preeclampsia and IUGR in prior pregnancy, currently pregnant 06/12/2016  . Supervision of high risk pregnancy, antepartum 06/12/2016  . 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 Infant feeding: plans to breastfeed. Cigarette smoking: never smoked. L&D discussion: symptoms of labor, discussed when to call, discussed what number to call, anesthetic/analgesic options reviewed and delivering clinician:  plans no preference. Postpartum supports and preparation: circumcision discussed and contraception plans discussed.  Follow up in 1 Week.

## 2016-11-06 ENCOUNTER — Inpatient Hospital Stay (HOSPITAL_COMMUNITY)
Admission: AD | Admit: 2016-11-06 | Discharge: 2016-11-08 | DRG: 775 | Disposition: A | Discharge status: Home / Self Care | Payer: Medicaid Other | Source: Ambulatory Visit | Attending: Obstetrics & Gynecology | Admitting: Obstetrics & Gynecology

## 2016-11-06 ENCOUNTER — Encounter (HOSPITAL_COMMUNITY): Payer: Self-pay

## 2016-11-06 DIAGNOSIS — Z3A38 38 weeks gestation of pregnancy: Secondary | ICD-10-CM

## 2016-11-06 DIAGNOSIS — Z7982 Long term (current) use of aspirin: Secondary | ICD-10-CM

## 2016-11-06 DIAGNOSIS — O99824 Streptococcus B carrier state complicating childbirth: Secondary | ICD-10-CM | POA: Diagnosis not present

## 2016-11-06 DIAGNOSIS — Z3493 Encounter for supervision of normal pregnancy, unspecified, third trimester: Secondary | ICD-10-CM | POA: Diagnosis present

## 2016-11-06 DIAGNOSIS — Z8249 Family history of ischemic heart disease and other diseases of the circulatory system: Secondary | ICD-10-CM | POA: Diagnosis not present

## 2016-11-06 DIAGNOSIS — O0990 Supervision of high risk pregnancy, unspecified, unspecified trimester: Secondary | ICD-10-CM

## 2016-11-06 DIAGNOSIS — O09299 Supervision of pregnancy with other poor reproductive or obstetric history, unspecified trimester: Secondary | ICD-10-CM

## 2016-11-06 DIAGNOSIS — Z833 Family history of diabetes mellitus: Secondary | ICD-10-CM | POA: Diagnosis not present

## 2016-11-06 LAB — CBC
HCT: 36.3 % (ref 36.0–46.0)
Hemoglobin: 11.8 g/dL — ABNORMAL LOW (ref 12.0–15.0)
MCH: 29.8 pg (ref 26.0–34.0)
MCHC: 32.5 g/dL (ref 30.0–36.0)
MCV: 91.7 fL (ref 78.0–100.0)
Platelets: 214 10*3/uL (ref 150–400)
RBC: 3.96 MIL/uL (ref 3.87–5.11)
RDW: 14.1 % (ref 11.5–15.5)
WBC: 7.6 10*3/uL (ref 4.0–10.5)

## 2016-11-06 LAB — COMPREHENSIVE METABOLIC PANEL
ALBUMIN: 3.2 g/dL — AB (ref 3.5–5.0)
ALT: 10 U/L — ABNORMAL LOW (ref 14–54)
ANION GAP: 8 (ref 5–15)
AST: 21 U/L (ref 15–41)
Alkaline Phosphatase: 215 U/L — ABNORMAL HIGH (ref 38–126)
BILIRUBIN TOTAL: 0.4 mg/dL (ref 0.3–1.2)
BUN: 11 mg/dL (ref 6–20)
CHLORIDE: 104 mmol/L (ref 101–111)
CO2: 21 mmol/L — AB (ref 22–32)
Calcium: 8.8 mg/dL — ABNORMAL LOW (ref 8.9–10.3)
Creatinine, Ser: 0.76 mg/dL (ref 0.44–1.00)
GFR calc Af Amer: >60 mL/min (ref >60)
GFR calc non Af Amer: >60 mL/min (ref >60)
GLUCOSE: 93 mg/dL (ref 65–99)
POTASSIUM: 4.4 mmol/L (ref 3.5–5.1)
SODIUM: 133 mmol/L — AB (ref 135–145)
TOTAL PROTEIN: 7.2 g/dL (ref 6.5–8.1)

## 2016-11-06 LAB — TYPE AND SCREEN
ABO/RH(D): O POS
Antibody Screen: NEGATIVE

## 2016-11-06 LAB — RPR: RPR: NONREACTIVE

## 2016-11-06 MED ORDER — OXYTOCIN BOLUS FROM INFUSION
500.0000 mL | Freq: Once | INTRAVENOUS | Status: AC
  Administered 2016-11-06: 500 mL via INTRAVENOUS

## 2016-11-06 MED ORDER — IBUPROFEN 600 MG PO TABS
600.0000 mg | ORAL_TABLET | Freq: Four times a day (QID) | ORAL | Status: DC
  Administered 2016-11-06 – 2016-11-07 (×6): 600 mg via ORAL
  Filled 2016-11-06 (×8): qty 1

## 2016-11-06 MED ORDER — OXYCODONE-ACETAMINOPHEN 5-325 MG PO TABS: 2.0000 | ORAL_TABLET | ORAL | Status: DC | PRN

## 2016-11-06 MED ORDER — LACTATED RINGERS IV SOLN: 500.0000 mL | INTRAVENOUS | Status: DC | PRN

## 2016-11-06 MED ORDER — TETANUS-DIPHTH-ACELL PERTUSSIS 5-2.5-18.5 LF-MCG/0.5 IM SUSP: 0.5000 mL | Freq: Once | INTRAMUSCULAR | Status: DC

## 2016-11-06 MED ORDER — ZOLPIDEM TARTRATE 5 MG PO TABS: 5.0000 mg | ORAL_TABLET | Freq: Every evening | ORAL | Status: DC | PRN

## 2016-11-06 MED ORDER — DIPHENHYDRAMINE HCL 25 MG PO CAPS: 25.0000 mg | ORAL_CAPSULE | Freq: Four times a day (QID) | ORAL | Status: DC | PRN

## 2016-11-06 MED ORDER — COCONUT OIL OIL
1.0000 "application " | TOPICAL_OIL | Status: DC | PRN
  Administered 2016-11-07: 1 via TOPICAL
  Filled 2016-11-06: qty 120

## 2016-11-06 MED ORDER — ACETAMINOPHEN 325 MG PO TABS: 650.0000 mg | ORAL_TABLET | ORAL | Status: DC | PRN

## 2016-11-06 MED ORDER — LIDOCAINE HCL (PF) 1 % IJ SOLN
30.0000 mL | INTRAMUSCULAR | Status: DC | PRN
  Filled 2016-11-06: qty 30

## 2016-11-06 MED ORDER — FENTANYL CITRATE (PF) 100 MCG/2ML IJ SOLN
100.0000 ug | INTRAMUSCULAR | Status: DC | PRN
  Administered 2016-11-06: 100 ug via INTRAVENOUS
  Filled 2016-11-06: qty 2

## 2016-11-06 MED ORDER — OXYTOCIN 40 UNITS IN LACTATED RINGERS INFUSION - SIMPLE MED
2.5000 [IU]/h | INTRAVENOUS | Status: DC
  Filled 2016-11-06: qty 1000

## 2016-11-06 MED ORDER — SODIUM CHLORIDE 0.9 % IV SOLN
2.0000 g | Freq: Once | INTRAVENOUS | Status: AC
  Administered 2016-11-06: 2 g via INTRAVENOUS
  Filled 2016-11-06: qty 2000

## 2016-11-06 MED ORDER — PRENATAL MULTIVITAMIN CH
1.0000 | ORAL_TABLET | Freq: Every day | ORAL | Status: DC
  Administered 2016-11-06 – 2016-11-07 (×2): 1 via ORAL
  Filled 2016-11-06 (×2): qty 1

## 2016-11-06 MED ORDER — BENZOCAINE-MENTHOL 20-0.5 % EX AERO: 1.0000 "application " | INHALATION_SPRAY | CUTANEOUS | Status: DC | PRN

## 2016-11-06 MED ORDER — LACTATED RINGERS IV SOLN
INTRAVENOUS | Status: DC
  Administered 2016-11-06: 02:00:00 via INTRAVENOUS

## 2016-11-06 MED ORDER — OXYCODONE-ACETAMINOPHEN 5-325 MG PO TABS: 1.0000 | ORAL_TABLET | ORAL | Status: DC | PRN

## 2016-11-06 MED ORDER — WITCH HAZEL-GLYCERIN EX PADS: 1.0000 "application " | MEDICATED_PAD | CUTANEOUS | Status: DC | PRN

## 2016-11-06 MED ORDER — SIMETHICONE 80 MG PO CHEW: 80.0000 mg | CHEWABLE_TABLET | ORAL | Status: DC | PRN

## 2016-11-06 MED ORDER — SOD CITRATE-CITRIC ACID 500-334 MG/5ML PO SOLN: 30.0000 mL | ORAL | Status: DC | PRN

## 2016-11-06 MED ORDER — ONDANSETRON HCL 4 MG/2ML IJ SOLN: 4.0000 mg | INTRAMUSCULAR | Status: DC | PRN

## 2016-11-06 MED ORDER — ONDANSETRON HCL 4 MG/2ML IJ SOLN: 4.0000 mg | Freq: Four times a day (QID) | INTRAMUSCULAR | Status: DC | PRN

## 2016-11-06 MED ORDER — DIBUCAINE 1 % RE OINT: 1.0000 "application " | TOPICAL_OINTMENT | RECTAL | Status: DC | PRN

## 2016-11-06 MED ORDER — SENNOSIDES-DOCUSATE SODIUM 8.6-50 MG PO TABS
2.0000 | ORAL_TABLET | ORAL | Status: DC
  Administered 2016-11-06: 2 via ORAL
  Filled 2016-11-06 (×2): qty 2

## 2016-11-06 MED ORDER — ONDANSETRON HCL 4 MG PO TABS: 4.0000 mg | ORAL_TABLET | ORAL | Status: DC | PRN

## 2016-11-06 NOTE — H&P (Signed)
LABOR AND DELIVERY ADMISSION HISTORY AND PHYSICAL NOTE  Crystal Santiago is a 25 y.o. female [id number] with IUP at 1787w5d by  LMP presenting for SOL.   She reports positive fetal movement. She denies leakage of fluid or vaginal bleeding.  Prenatal History/Complications: GBS+  Past Medical History: Past Medical History:  Diagnosis Date  . Anemia   . Hypertension in pregnancy, preeclampsia    IUGR   Past Surgical History: Past Surgical History:  Procedure Laterality Date  . NO PAST SURGERIES      Obstetrical History: OB History    Gravida Para Term Preterm AB Living   3 1 1   1 1    SAB TAB Ectopic Multiple Live Births   1     0 1      Social History: Social History   Social History  . Marital status: Single    Spouse name: N/A  . Number of children: N/A  . Years of education: N/A   Social History Main Topics  . Smoking status: Never Smoker  . Smokeless tobacco: Never Used  . Alcohol use No  . Drug use: No  . Sexual activity: Not Currently   Other Topics Concern  . None   Social History Narrative  . None    Family History: Family History  Problem Relation Age of Onset  . Alcohol abuse Father   . Bipolar disorder Maternal Aunt   . Hypertension Maternal Grandmother   . Diabetes Maternal Grandmother   . Diabetes Maternal Grandfather   . Hypertension Paternal Grandmother   . Diabetes Paternal Grandmother   . Diabetes Paternal Grandfather     Allergies: No Known Allergies  Prescriptions Prior to Admission  Medication Sig Dispense Refill Last Dose  . aspirin EC 81 MG tablet Take 1 tablet (81 mg total) by mouth daily. Take after 12 weeks for prevention of preeclampsia later in pregnancy 300 tablet 2 Past Week at Unknown time  . Prenatal Vit-Fe Phos-FA-Omega (VITAFOL GUMMIES) 3.33-0.333-34.8 MG CHEW Chew 3 tablets by mouth daily. 90 tablet 8 Past Month at Unknown time  . Butalbital-APAP-Caffeine 50-325-40 MG capsule Take 1-2 capsules by mouth every 6 (six)  hours as needed for headache. (Patient not taking: Reported on 11/06/2016) 30 capsule 3 Unknown at Unknown time   Review of Systems   All systems reviewed and negative except as stated in HPI  Blood pressure 131/87, pulse 81, temperature 98.5 F (36.9 C), temperature source Oral, resp. rate 18, height 5\' 6"  (1.676 m), weight 147 lb (66.7 kg), last menstrual period 02/09/2016, unknown if currently breastfeeding. General appearance: alert, cooperative and appears stated age Lungs: clear to auscultation bilaterally Heart: regular rate and rhythm Abdomen: soft, non-tender; bowel sounds normal Extremities: No calf swelling or tenderness Presentation: cephalic Fetal monitoring: Baseline 120s, moderate variability, +accels, no decels Uterine activity: Q2-3 min Dilation: 10 Effacement (%): 100 Station: +1 Exam by:: Laural RoesB. Parks, RN   Prenatal labs: ABO, Rh: --/--/O POS (12/11 0200) Antibody: NEG (12/11 0200) Rubella: !Error! RPR: Non Reactive (10/03 1155)  HBsAg: Negative (07/17 1447)  HIV: Non Reactive (10/03 1155)  GBS: Positive (11/17 1151)  1 hr Glucola: wnl Genetic screening:  negative Anatomy US: normal female  Prenatal Transfer Tool  Maternal Diabetes: No Genetic Screening: Normal Maternal Ultrasounds/Referrals: Normal Fetal Ultrasounds or other Referrals:  None Maternal Substance Abuse:  No Significant Maternal Medications:  None Significant Maternal Lab Results: None  Results for orders placed or performed during the hospital encounter of 11/06/16 (from  the past 24 hour(s))  Comprehensive metabolic panel   Collection Time: 11/06/16  2:00 AM  Result Value Ref Range   Sodium 133 (L) 135 - 145 mmol/L   Potassium 4.4 3.5 - 5.1 mmol/L   Chloride 104 101 - 111 mmol/L   CO2 21 (L) 22 - 32 mmol/L   Glucose, Bld 93 65 - 99 mg/dL   BUN 11 6 - 20 mg/dL   Creatinine, Ser 4.090.76 0.44 - 1.00 mg/dL   Calcium 8.8 (L) 8.9 - 10.3 mg/dL   Total Protein 7.2 6.5 - 8.1 g/dL   Albumin 3.2  (L) 3.5 - 5.0 g/dL   AST 21 15 - 41 U/L   ALT 10 (L) 14 - 54 U/L   Alkaline Phosphatase 215 (H) 38 - 126 U/L   Total Bilirubin 0.4 0.3 - 1.2 mg/dL   GFR calc non Af Amer >60 >60 mL/min   GFR calc Af Amer >60 >60 mL/min   Anion gap 8 5 - 15  CBC   Collection Time: 11/06/16  2:00 AM  Result Value Ref Range   WBC 7.6 4.0 - 10.5 K/uL   RBC 3.96 3.87 - 5.11 MIL/uL   Hemoglobin 11.8 (L) 12.0 - 15.0 g/dL   HCT 81.136.3 91.436.0 - 78.246.0 %   MCV 91.7 78.0 - 100.0 fL   MCH 29.8 26.0 - 34.0 pg   MCHC 32.5 30.0 - 36.0 g/dL   RDW 95.614.1 21.311.5 - 08.615.5 %   Platelets 214 150 - 400 K/uL  Type and [facility]   Collection Time: 11/06/16  2:00 AM  Result Value Ref Range   ABO/RH(D) O POS    Antibody Screen NEG    Sample Expiration 11/09/2016     Patient Active Problem List   Diagnosis Date Noted  . Normal labor 11/06/2016  . GBS (group B Streptococcus carrier), +RV culture, currently pregnant 10/17/2016  . History of preeclampsia and IUGR in prior pregnancy, currently pregnant 06/12/2016  . Supervision of high risk pregnancy, antepartum 06/12/2016  . 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 G3P1011 at 6751w5d here for SOL.   #Labor:  Anticipate NSVD.   #Pain: May request epidural #FWB:  Cat I #ID:  GBS +, ampicillin given #MOF: breast #MOC: BTL, papers signed #Circ:  N/A  Freddrick MarchYashika Amin, MD PGY-1 11/06/2016, 4:32 AM

## 2016-11-06 NOTE — MAU Note (Signed)
Pt c/o contractions every 2-3 mins and states her water broke at 0130-clear. Denies bleeding. +FM. Cervix was 1cm on last exam.

## 2016-11-06 NOTE — Lactation Note (Signed)
This note was copied from a baby's chart. Lactation Consultation Note  Baby is 10 HOL and has BF 3x.  Mom reports that BF is going well. SHe was able to demonstrate hand expression. Her first breast feeding experience lasted 6 mos. Mom was advised to wean first by related to a heart condition that was making him fatigue at the breast. Reviewed cue based feeding and output for days of life. She denies any concerns at this time. Follow-up tomorrow or sooner if needed.  Patient Name: Crystal Lorrene ReidJustina Jane ZOXWR'UToday's Date: 11/06/2016 Reason for consult: Initial assessment   Maternal Data Has patient been taught Hand Expression?: Yes Does the patient have breastfeeding experience prior to this delivery?: Yes  Feeding Feeding Type: Breast Fed Length of feed: 2 min  LATCH Score/Interventions                      Lactation Tools Discussed/Used     Consult Status Consult Status: Follow-up Date: 11/07/16 Follow-up type: In-patient    Soyla DryerJoseph, Darwin Guastella 11/06/2016, 2:00 PM

## 2016-11-07 NOTE — Lactation Note (Signed)
This note was copied from a baby's chart. Lactation Consultation Note  Patient Name: Crystal Lorrene ReidJustina Birchard QIONG'EToday's Date: 11/07/2016 Reason for consult: Follow-up assessment;Infant < 6lbs Mom reports baby is nursing well, she has started to post pump and supplement EBM she receives with pumping back to baby. Mom does report nipple tenderness, no breakdown reported. Baby has just passed stool prior to my arrival per Mom's report. Voiding frequently. Mom plans to continue to BF with each feeding, post pump and supplement. Advised baby should be at breast 8-12 times or more in 24 hours, try to keep active at breast 15-20 minutes both breasts some feedings. Encouraged Mom to call for latch check due to sore nipples.   Maternal Data    Feeding Feeding Type: Breast Fed Length of feed: 15 min  LATCH Score/Interventions Latch: Grasps breast easily, tongue down, lips flanged, rhythmical sucking.  Audible Swallowing: None Intervention(s): Hand expression  Type of Nipple: Everted at rest and after stimulation  Comfort (Breast/Nipple): Filling, red/small blisters or bruises, mild/mod discomfort     Hold (Positioning): No assistance needed to correctly position infant at breast.  LATCH Score: 7  Lactation Tools Discussed/Used     Consult Status Consult Status: Follow-up Date: 11/08/16 Follow-up type: In-patient    Alfred LevinsGranger, Geneen Dieter Ann 11/07/2016, 4:51 PM

## 2016-11-07 NOTE — Progress Notes (Signed)
Post Partum Day 1 Subjective: no complaints, up ad lib, voiding, tolerating PO and + flatus. Not sure which IUD she wants, thinks it may be the copper.   Objective: Blood pressure 106/67, pulse 72, temperature 98.5 F (36.9 C), temperature source Oral, resp. rate 18, height 5\' 6"  (1.676 m), weight 147 lb (66.7 kg), last menstrual period 02/09/2016, unknown if currently breastfeeding.  Physical Exam:  General: alert, cooperative and no distress Lochia: appropriate Uterine Fundus: firm DVT Evaluation: No evidence of DVT seen on physical exam.   Recent Labs  11/06/16 0200  HGB 11.8*  HCT 36.3    Assessment/Plan: Plan for discharge tomorrow   LOS: 1 day   Clearance Cootsndrew Tyson 11/07/2016, 2:39 PM    OB FELLOW POSTPARTUM PROGRESS NOTE ATTESTATION  I have seen and examined this patient and agree with above documentation in the resident's note.   Jen MowElizabeth Erasmus Bistline, DO OB Fellow

## 2016-11-08 MED ORDER — SENNOSIDES-DOCUSATE SODIUM 8.6-50 MG PO TABS: 2.0000 | ORAL_TABLET | ORAL | 0 refills | Status: AC

## 2016-11-08 MED ORDER — IBUPROFEN 600 MG PO TABS: 600.0000 mg | ORAL_TABLET | Freq: Four times a day (QID) | ORAL | 0 refills | Status: DC

## 2016-11-08 MED ORDER — MEDROXYPROGESTERONE ACETATE 150 MG/ML IM SUSP: 150.0000 mg | Freq: Once | INTRAMUSCULAR | 0 refills | Status: AC

## 2016-11-08 NOTE — Discharge Instructions (Signed)

## 2016-11-08 NOTE — Lactation Note (Signed)
This note was copied from a baby's chart. Lactation Consultation Note  Patient Name: Crystal Santiago WUJWJ'XToday's Date: 11/08/2016 Reason for consult: Follow-up assessment;Infant < 6lbs;Infant weight loss;Other (Comment) (385/7 GA) Baby is 54 hours, 4% weight loss 5-14.2 oz today. LC reviewed doc flow sheets - baby has been consistent  At the breast and per mom during the night the baby was cluster feeding so much I was getting sore nipples  So I had mom husband bring my DEBP and I've been pumping , and supplemented x2 formula .  LC noted both supplements were 60 ml each. LC reviewed basics of supple and demand and mentioned to  mom if she plans to supplement until her milk comes in to feed 1st breast ( watch for baby signs of being satisfied   body language ) supplement after wards 30 ml and post pump both breast 10 -15 mins, save milk and when volume  Increases switch to supplementing with EBM. When breast are fuller , offer 2nd breast , and decrease on supplement If weight loss is stabilizing. Per mom had some engorgement with her 1st baby ( baby was in NICU ) . LC reviewed sore  Nipple and engorgement prevention and tx. Mom has her  DEBP - Medela. LC reminded mom the membrane has to be removed  To be cleaned.  Mother informed of post-discharge support and given phone number to the lactation department, including services for phone call assistance; out-patient appointments; and breastfeeding support group. List of other breastfeeding resources in the community given in the handout. Encouraged mother to call for problems or concerns related to breastfeeding.  Maternal Data Has patient been taught Hand Expression?: Yes (per mom feels comfortable )  Feeding Feeding Type:  (per mom recently breast fed ) Length of feed: 30 min  LATCH Score/Interventions                Intervention(s): Breastfeeding basics reviewed     Lactation Tools Discussed/Used WIC Program: No Pump Review:  Setup, frequency, and cleaning;Milk Storage Initiated by:: MAI  Date initiated:: 11/08/16   Consult Status Consult Status: Complete Date: 11/08/16    Crystal Santiago 11/08/2016, 10:20 AM

## 2016-11-08 NOTE — Discharge Summary (Signed)
OB Discharge Summary     Patient Name: Airyn L Gewirtz DOB: 03/18/1991 MRN: [id number]  Date of admission: 11/06/2016 DeliverinRory Percyg MD: Freddrick MarchAMIN, YASHIKA   Date of discharge: 11/08/2016  Admitting diagnosis: 38 WEEKS IN LABOR AND HER WATER JUST BROKE Intrauterine pregnancy: 4464w5d     Secondary diagnosis:  Active Problems:   Normal labor  Additional problems: No significant     Discharge diagnosis: Term Pregnancy Delivered                                                                                                Post partum procedures:None  Augmentation: None  Complications: None  Hospital course:  Onset of Labor With Vaginal Delivery     25 y.o. yo [id number] at 3164w5d was admitted in Active Labor on 11/06/2016. Patient had an uncomplicated labor course as follows:  Membrane Rupture Time/Date: 1:30 AM ,11/06/2016   Intrapartum Procedures: Episiotomy: None [1]                                         Lacerations:  None [1]  Patient had a delivery of a Viable infant. 11/06/2016  Information for the patient's newborn:  Deliah BostonGilliam, Girl Tanetta [[id number]][[id number]]  Delivery Method: Vag-Spont    Pateint had an uncomplicated postpartum course.  She is ambulating, tolerating a regular diet, passing flatus, and urinating well. Patient is discharged home in stable condition on 11/08/16.    Physical exam Vitals:   11/06/16 1800 11/07/16 0535 11/07/16 1856 11/08/16 0614  BP: 118/81 106/67 116/78 127/75  Pulse: 70 72 79 83  Resp: 18 18 18 18   Temp: 98.7 F (37.1 C) 98.5 F (36.9 C) 97.8 F (36.6 C) 97.4 F (36.3 C)  TempSrc: Oral Oral Oral Oral  Weight:      Height:       General: alert, cooperative and no distress Lochia: appropriate Uterine Fundus: firm Incision: N/A DVT Evaluation: No evidence of DVT seen on physical exam. Labs: Lab Results  Component Value Date   WBC 7.6 11/06/2016   HGB 11.8 (L) 11/06/2016   HCT 36.3 11/06/2016   MCV 91.7 11/06/2016   PLT 214 11/06/2016    CMP Latest Ref Rng & Units 11/06/2016  Glucose 65 - 99 mg/dL 93  BUN 6 - 20 mg/dL 11  Creatinine 1.300.44 - 8.651.00 mg/dL 7.840.76  Sodium 696135 - 295145 mmol/L 133(L)  Potassium 3.5 - 5.1 mmol/L 4.4  Chloride 101 - 111 mmol/L 104  CO2 22 - 32 mmol/L 21(L)  Calcium 8.9 - 10.3 mg/dL 2.8(U8.8(L)  Total Protein 6.5 - 8.1 g/dL 7.2  Total Bilirubin 0.3 - 1.2 mg/dL 0.4  Alkaline Phos 38 - 126 U/L 215(H)  AST 15 - 41 U/L 21  ALT 14 - 54 U/L 10(L)    Discharge instruction: per After Visit Summary and "Baby and Me Booklet".  After visit meds:    Medication List    TAKE these medications   aspirin EC 81 MG tablet Take 1 tablet (81  mg total) by mouth daily. Take after 12 weeks for prevention of preeclampsia later in pregnancy   ibuprofen 600 MG tablet Commonly known as:  ADVIL,MOTRIN Take 1 tablet (600 mg total) by mouth every 6 (six) hours.   medroxyPROGESTERone 150 MG/ML injection Commonly known as:  DEPO-PROVERA Inject 1 mL (150 mg total) into the muscle once.   senna-docusate 8.6-50 MG tablet Commonly known as:  Senokot-S Take 2 tablets by mouth daily. Start taking on:  11/09/2016   VITAFOL GUMMIES 3.33-0.333-34.8 MG Chew Chew 3 tablets by mouth daily.       Diet: routine diet  Activity: Advance as tolerated. Pelvic rest for 6 weeks.   Outpatient follow up:6 weeks Follow up Appt:No future appointments. Follow up Visit:No Follow-up on file.  Postpartum contraception: Depo Provera  Newborn Data: Live born female  Birth Weight: 6 lb 1.7 oz (2770 g) APGAR: 8, 9  Baby Feeding: Bottle Disposition:home with mother   11/08/2016 Freddrick MarchYashika Amin, MD  Patient was seen and examined by me also Agree with note Vitals stable Labs stable Fundus firm, lochia within normal limits Perineum healing Ext WNL Ready for discharge  Aviva SignsMarie L Evelynne Spiers, CNM

## 2016-11-09 ENCOUNTER — Telehealth: Payer: Self-pay

## 2016-11-09 NOTE — Telephone Encounter (Signed)
Returned call, no answer, left vm to call 

## 2016-11-10 ENCOUNTER — Encounter: Payer: Medicaid Other | Admitting: Certified Nurse Midwife

## 2016-11-11 NOTE — H&P (Signed)
H&P Date of Service: 11/06/2016 4:32 AM      [] Hide copied text [] Hover for attribution information LABOR AND DELIVERY ADMISSION HISTORY AND PHYSICAL NOTE  Crystal Santiago is a 25 y.o. female G23P1011 with IUP at [redacted]w[redacted]d by  LMP presenting for SOL.   She reports positive fetal movement. She denies leakage of fluid or vaginal bleeding.  Prenatal History/Complications: GBS+  Past Medical History:     Past Medical History:  Diagnosis Date  . Anemia   . Hypertension in pregnancy, preeclampsia    IUGR   Past Surgical History:      Past Surgical History:  Procedure Laterality Date  . NO PAST SURGERIES      Obstetrical History:         OB History    Gravida Para Term Preterm AB Living   3 1 1   1 1    SAB TAB Ectopic Multiple Live Births   1     0 1      Social History: Social History        Social History  . Marital status: Single    Spouse name: N/A  . Number of children: N/A  . Years of education: N/A       Social History Main Topics  . Smoking status: Never Smoker  . Smokeless tobacco: Never Used  . Alcohol use No  . Drug use: No  . Sexual activity: Not Currently       Other Topics Concern  . None   Social History Narrative  . None    Family History:      Family History  Problem Relation Age of Onset  . Alcohol abuse Father   . Bipolar disorder Maternal Aunt   . Hypertension Maternal Grandmother   . Diabetes Maternal Grandmother   . Diabetes Maternal Grandfather   . Hypertension Paternal Grandmother   . Diabetes Paternal Grandmother   . Diabetes Paternal Grandfather     Allergies: No Known Allergies         Prescriptions Prior to Admission  Medication Sig Dispense Refill Last Dose  . aspirin EC 81 MG tablet Take 1 tablet (81 mg total) by mouth daily. Take after 12 weeks for prevention of preeclampsia later in pregnancy 300 tablet 2 Past Week at Unknown time  . Prenatal Vit-Fe Phos-FA-Omega (VITAFOL  GUMMIES) 3.33-0.333-34.8 MG CHEW Chew 3 tablets by mouth daily. 90 tablet 8 Past Month at Unknown time  . Butalbital-APAP-Caffeine 50-325-40 MG capsule Take 1-2 capsules by mouth every 6 (six) hours as needed for headache. (Patient not taking: Reported on 11/06/2016) 30 capsule 3 Unknown at Unknown time   Review of Systems   All systems reviewed and negative except as stated in HPI  Blood pressure 131/87, pulse 81, temperature 98.5 F (36.9 C), temperature source Oral, resp. rate 18, height 5\' 6"  (1.676 m), weight 147 lb (66.7 kg), last menstrual period 02/09/2016, unknown if currently breastfeeding. General appearance: alert, cooperative and appears stated age Lungs: clear to auscultation bilaterally Heart: regular rate and rhythm Abdomen: soft, non-tender; bowel sounds normal Extremities: No calf swelling or tenderness Presentation: cephalic Fetal monitoring: Baseline 120s, moderate variability, +accels, no decels Uterine activity: Q2-3 min Dilation: 10 Effacement (%): 100 Station: +1 Exam by:: Laural Roes, RN   Prenatal labs: ABO, Rh: --/--/O POS (12/11 0200) Antibody: NEG (12/11 0200) Rubella: !Error! RPR: Non Reactive (10/03 1155)  HBsAg: Negative (07/17 1447)  HIV: Non Reactive (10/03 1155)  GBS: Positive (11/17 1151)  1  hr Glucola: wnl Genetic screening:  negative Anatomy US: normal female  Prenatal Transfer Tool  Maternal Diabetes: No Genetic Screening: Normal Maternal Ultrasounds/Referrals: Normal Fetal Ultrasounds or other Referrals:  None Maternal Substance Abuse:  No Significant Maternal Medications:  None Significant Maternal Lab Results: None       Results for orders placed or performed during the hospital encounter of 11/06/16 (from the past 24 hour(s))  Comprehensive metabolic panel   Collection Time: 11/06/16  2:00 AM  Result Value Ref Range   Sodium 133 (L) 135 - 145 mmol/L   Potassium 4.4 3.5 - 5.1 mmol/L   Chloride 104 101 - 111 mmol/L     CO2 21 (L) 22 - 32 mmol/L   Glucose, Bld 93 65 - 99 mg/dL   BUN 11 6 - 20 mg/dL   Creatinine, Ser 9.600.76 0.44 - 1.00 mg/dL   Calcium 8.8 (L) 8.9 - 10.3 mg/dL   Total Protein 7.2 6.5 - 8.1 g/dL   Albumin 3.2 (L) 3.5 - 5.0 g/dL   AST 21 15 - 41 U/L   ALT 10 (L) 14 - 54 U/L   Alkaline Phosphatase 215 (H) 38 - 126 U/L   Total Bilirubin 0.4 0.3 - 1.2 mg/dL   GFR calc non Af Amer >60 >60 mL/min   GFR calc Af Amer >60 >60 mL/min   Anion gap 8 5 - 15  CBC   Collection Time: 11/06/16  2:00 AM  Result Value Ref Range   WBC 7.6 4.0 - 10.5 K/uL   RBC 3.96 3.87 - 5.11 MIL/uL   Hemoglobin 11.8 (L) 12.0 - 15.0 g/dL   HCT 45.436.3 09.836.0 - 11.946.0 %   MCV 91.7 78.0 - 100.0 fL   MCH 29.8 26.0 - 34.0 pg   MCHC 32.5 30.0 - 36.0 g/dL   RDW 14.714.1 82.911.5 - 56.215.5 %   Platelets 214 150 - 400 K/uL  [facility] and screen [facility]   Collection Time: 11/06/16  2:00 AM  Result Value Ref Range   ABO/RH(D) O POS    Antibody Screen NEG    Sample Expiration 11/09/2016         Patient Active Problem List   Diagnosis Date Noted  . Normal labor 11/06/2016  . GBS (group B Streptococcus carrier), +RV culture, currently pregnant 10/17/2016  . History of preeclampsia and IUGR in prior pregnancy, currently pregnant 06/12/2016  . Supervision of high risk pregnancy, antepartum 06/12/2016  . 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 G3P1011 at 5736w5d here for SOL.   #Labor:  Anticipate NSVD.   #Pain:  May request epidural #FWB:  Cat I #ID:      GBS +, ampicillin given #MOF: breast #MOC: BTL, papers signed #Circ:   N/A  Freddrick MarchYashika Amin, MD PGY-1 11/06/2016, 4:32 AM        H&P Date of Service: 11/06/2016 4:32 AM      [] Hide copied text [] Hover for attribution information LABOR AND DELIVERY ADMISSION HISTORY AND PHYSICAL NOTE  Crystal Santiago is a 25 y.o. female G3P1011 with IUP at  4636w5d by  LMP presenting for SOL.   She reports positive fetal movement. She denies leakage of fluid or vaginal bleeding.  Prenatal History/Complications: GBS+  Past Medical History:     Past Medical History:  Diagnosis Date  . Anemia   . Hypertension in pregnancy, preeclampsia    IUGR   Past  Surgical History:      Past Surgical History:  Procedure Laterality Date  . NO PAST SURGERIES      Obstetrical History:         OB History    Gravida Para Term Preterm AB Living   3 1 1   1 1    SAB TAB Ectopic Multiple Live Births   1     0 1      Social History: Social History        Social History  . Marital status: Single    Spouse name: N/A  . Number of children: N/A  . Years of education: N/A       Social History Main Topics  . Smoking status: Never Smoker  . Smokeless tobacco: Never Used  . Alcohol use No  . Drug use: No  . Sexual activity: Not Currently       Other Topics Concern  . None      Social History Narrative  . None    Family History:      Family History  Problem Relation Age of Onset  . Alcohol abuse Father   . Bipolar disorder Maternal Aunt   . Hypertension Maternal Grandmother   . Diabetes Maternal Grandmother   . Diabetes Maternal Grandfather   . Hypertension Paternal Grandmother   . Diabetes Paternal Grandmother   . Diabetes Paternal Grandfather     Allergies: No Known Allergies         Prescriptions Prior to Admission  Medication Sig Dispense Refill Last Dose  . aspirin EC 81 MG tablet Take 1 tablet (81 mg total) by mouth daily. Take after 12 weeks for prevention of preeclampsia later in pregnancy 300 tablet 2 Past Week at Unknown time  . Prenatal Vit-Fe Phos-FA-Omega (VITAFOL GUMMIES) 3.33-0.333-34.8 MG CHEW Chew 3 tablets by mouth daily. 90 tablet 8 Past Month at Unknown time  . Butalbital-APAP-Caffeine 50-325-40 MG capsule Take 1-2 capsules by mouth every 6 (six) hours as needed for  headache. (Patient not taking: Reported on 11/06/2016) 30 capsule 3 Unknown at Unknown time   Review of Systems   All systems reviewed and negative except as stated in HPI  Blood pressure 131/87, pulse 81, temperature 98.5 F (36.9 C), temperature source Oral, resp. rate 18, height 5\' 6"  (1.676 m), weight 147 lb (66.7 kg), last menstrual period 02/09/2016, unknown if currently breastfeeding. General appearance: alert, cooperative and appears stated age Lungs: clear to auscultation bilaterally Heart: regular rate and rhythm Abdomen: soft, non-tender; bowel sounds normal Extremities: No calf swelling or tenderness Presentation: cephalic Fetal monitoring: Baseline 120s, moderate variability, +accels, no decels Uterine activity: Q2-3 min Dilation: 10 Effacement (%): 100 Station: +1 Exam by:: Laural Roes, RN   Prenatal labs: ABO, Rh: --/--/O POS (12/11 0200) Antibody: NEG (12/11 0200) Rubella: !Error! RPR: Non Reactive (10/03 1155)  HBsAg: Negative (07/17 1447)  HIV: Non Reactive (10/03 1155)  GBS: Positive (11/17 1151)  1 hr Glucola: wnl Genetic screening:  negative Anatomy US: normal female  Prenatal Transfer Tool  Maternal Diabetes: No Genetic Screening: Normal Maternal Ultrasounds/Referrals: Normal Fetal Ultrasounds or other Referrals:  None Maternal Substance Abuse:  No Significant Maternal Medications:  None Significant Maternal Lab Results: None       Results for orders placed or performed during the hospital encounter of 11/06/16 (from the past 24 hour(s))  Comprehensive metabolic panel   Collection Time: 11/06/16  2:00 AM  Result Value Ref Range   Sodium 133 (L) 135 -  145 mmol/L   Potassium 4.4 3.5 - 5.1 mmol/L   Chloride 104 101 - 111 mmol/L   CO2 21 (L) 22 - 32 mmol/L   Glucose, Bld 93 65 - 99 mg/dL   BUN 11 6 - 20 mg/dL   Creatinine, Ser 1.610.76 0.44 - 1.00 mg/dL   Calcium 8.8 (L) 8.9 - 10.3 mg/dL   Total Protein 7.2 6.5 - 8.1 g/dL   Albumin  3.2 (L) 3.5 - 5.0 g/dL   AST 21 15 - 41 U/L   ALT 10 (L) 14 - 54 U/L   Alkaline Phosphatase 215 (H) 38 - 126 U/L   Total Bilirubin 0.4 0.3 - 1.2 mg/dL   GFR calc non Af Amer >60 >60 mL/min   GFR calc Af Amer >60 >60 mL/min   Anion gap 8 5 - 15  CBC   Collection Time: 11/06/16  2:00 AM  Result Value Ref Range   WBC 7.6 4.0 - 10.5 K/uL   RBC 3.96 3.87 - 5.11 MIL/uL   Hemoglobin 11.8 (L) 12.0 - 15.0 g/dL   HCT 09.636.3 04.536.0 - 40.946.0 %   MCV 91.7 78.0 - 100.0 fL   MCH 29.8 26.0 - 34.0 pg   MCHC 32.5 30.0 - 36.0 g/dL   RDW 81.114.1 91.411.5 - 78.215.5 %   Platelets 214 150 - 400 K/uL  Type and screen [facility]   Collection Time: 11/06/16  2:00 AM  Result Value Ref Range   ABO/RH(D) O POS    Antibody Screen NEG    Sample Expiration 11/09/2016         Patient Active Problem List   Diagnosis Date Noted  . Normal labor 11/06/2016  . GBS (group B Streptococcus carrier), +RV culture, currently pregnant 10/17/2016  . History of preeclampsia and IUGR in prior pregnancy, currently pregnant 06/12/2016  . Supervision of high risk pregnancy, antepartum 06/12/2016  . ASCUS with positive high risk HPV cervical on 06/12/16 06/12/2016    Assessment: Crystal PercyJustina L Fabro is a 25 y.o. G3P1011 at 9956w5d here for SOL.   #Labor:  Anticipate NSVD.   #Pain:  May request epidural #FWB:  Cat I #ID:      GBS +, ampicillin given #MOF: breast #MOC: BTL, papers signed #Circ:   N/A  Freddrick MarchYashika Amin, MD PGY-1 11/06/2016, 4:32 AM

## 2016-11-17 ENCOUNTER — Encounter: Payer: Medicaid Other | Admitting: Certified Nurse Midwife

## 2016-11-30 ENCOUNTER — Ambulatory Visit (INDEPENDENT_AMBULATORY_CARE_PROVIDER_SITE_OTHER): Payer: Medicaid Other | Admitting: Family Medicine

## 2016-11-30 ENCOUNTER — Encounter: Payer: Self-pay | Admitting: Family Medicine

## 2016-11-30 ENCOUNTER — Ambulatory Visit: Payer: Medicaid Other | Admitting: Family Medicine

## 2016-11-30 VITALS — BP 117/64 | HR 68 | Temp 97.4°F | Wt 126.0 lb

## 2016-11-30 DIAGNOSIS — Z3009 Encounter for other general counseling and advice on contraception: Secondary | ICD-10-CM

## 2016-11-30 NOTE — Patient Instructions (Signed)
Contraceptive Implant Information A contraceptive implant is a plastic rod that is inserted under your skin. It is usually inserted under the skin of your upper arm. It continually releases small amounts of progestin (synthetic progesterone) into your bloodstream. This prevents an egg from being released from your ovaries. It also thickens your cervical mucus to prevent sperm from entering the cervix, and it thins your uterine lining to prevent a fertilized egg from attaching to your uterus. Contraceptive implants can be effective for up to 3 years. They do not provide protection against sexually transmitted diseases (STDs).  The procedure to insert an implant usually takes about 10 minutes. There may be minor bruising, swelling, and discomfort at the insertion site for a couple days. The implant begins to work within the first day. Other contraceptive protection may be necessary for 7 days. Be sure to discuss with your health care provider if you need a backup method of contraception.  Your health care provider will make sure you are a good candidate for the contraceptive implant. Discuss with your health care provider the possible side effects of the implant. ADVANTAGES  It prevents pregnancy for up to 3 years.  It is easily reversible.  It is convenient.  It can be used when breastfeeding.  It can be used by women who cannot take estrogen. DISADVANTAGES  You may have irregular or unplanned vaginal bleeding.  You may develop side effects, including headache, weight gain, acne, breast tenderness, or mood changes.  You may have tissue or nerve damage after insertion (rare).  It may be difficult and uncomfortable to remove.  Certain medicines may interfere with the effectiveness of the implant. REMOVAL OF IMPLANT The implant should be removed in 3 years or as directed by your health care provider. The implant's effect wears off in a few hours after removal. Your ability to get pregnant  (fertility) may be restored in 1-2 weeks. A new implant can be inserted as soon as the old one is removed if desired. CONTRAINDICATIONS You should not get the implant if you are experiencing any of the following situations:  You are pregnant.  You have a history of breast cancer, osteoporosis, blood clots, heart disease, diabetes, high blood pressure, liver disease, tumors, or stroke.   You have undiagnosed vaginal bleeding.  You have a sensitivity to any part of the implant. This information is not intended to replace advice given to you by your health care provider. Make sure you discuss any questions you have with your health care provider. Document Released: 11/02/2011 Document Revised: 07/16/2013 Document Reviewed: 05/12/2013 Elsevier Interactive Patient Education  2017 [employer]  

## 2016-11-30 NOTE — Progress Notes (Signed)
Post Partum Exam  Crystal Santiago is a 26 y.o. [id number] female who presents for a postpartum visit. She is 4 weeks postpartum following a spontaneous vaginal delivery. I have fully reviewed the prenatal and intrapartum course. The delivery was at 38.5 gestational weeks.  Anesthesia: none. Postpartum course has been uncomplicated. Baby's course has been uncomplicated. Baby is feeding by breast. Bleeding no bleeding. Bowel function is normal. Bladder function is normal. Patient is not sexually active. Contraception method is none. Postpartum depression screening:neg  The following portions of the patient's history were reviewed and updated as appropriate: allergies, current medications, past family history, past medical history, past social history, past surgical history and problem list.  Review of Systems Pertinent items are noted in HPI.    Objective:    BP 116/78 mmHg  Pulse 78  Resp 16  Ht 5\' 5"  (1.651 m)  Wt 211 lb (95.709 kg)  BMI 35.11 kg/m2  Breastfeeding? Yes  General:  alert, cooperative and appears stated age   Breasts:  inspection negative, no nipple discharge or bleeding, no masses or nodularity palpable  Lungs: clear to auscultation bilaterally  Heart:  regular rate and rhythm, S1, S2 normal, no murmur, click, rub or gallop  Abdomen: soft, non-tender; bowel sounds normal; no masses,  no organomegaly  Psych:  Normal affect and mood.   Extremities:  No edema        Assessment:   4 weeks postpartum exam. Pap smear performed 06/12/16, ASCUS +HRHPV.   Plan:   1. Contraception: Nexplanon, to be performed at next visit. 2. Needs repeat pap in 1 year from 05/2016. 3. Follow up in: 2 weeks or as needed.

## 2016-12-04 DIAGNOSIS — R87612 Low grade squamous intraepithelial lesion on cytologic smear of cervix (LGSIL): Secondary | ICD-10-CM | POA: Insufficient documentation

## 2016-12-18 ENCOUNTER — Ambulatory Visit: Payer: Medicaid Other | Admitting: Obstetrics and Gynecology

## 2016-12-25 ENCOUNTER — Ambulatory Visit (INDEPENDENT_AMBULATORY_CARE_PROVIDER_SITE_OTHER): Payer: Medicaid Other | Admitting: Obstetrics and Gynecology

## 2016-12-25 ENCOUNTER — Encounter: Payer: Self-pay | Admitting: Obstetrics and Gynecology

## 2016-12-25 VITALS — BP 113/73 | HR 58 | Temp 97.5°F | Wt 124.0 lb

## 2016-12-25 DIAGNOSIS — Z3009 Encounter for other general counseling and advice on contraception: Secondary | ICD-10-CM

## 2016-12-25 DIAGNOSIS — Z30017 Encounter for initial prescription of implantable subdermal contraceptive: Secondary | ICD-10-CM

## 2016-12-25 DIAGNOSIS — Z3202 Encounter for pregnancy test, result negative: Secondary | ICD-10-CM

## 2016-12-25 DIAGNOSIS — Z3049 Encounter for surveillance of other contraceptives: Secondary | ICD-10-CM | POA: Diagnosis not present

## 2016-12-25 LAB — POCT URINE PREGNANCY: Preg Test, Ur: NEGATIVE

## 2016-12-25 MED ORDER — ETONOGESTREL 68 MG ~~LOC~~ IMPL
68.0000 mg | DRUG_IMPLANT | Freq: Once | SUBCUTANEOUS | Status: AC
  Administered 2016-12-25: 68 mg via SUBCUTANEOUS

## 2016-12-25 NOTE — Progress Notes (Signed)
Patient is in the office for nexplanon insertion. 

## 2016-12-25 NOTE — Progress Notes (Signed)
Patient given informed consent, signed copy in the chart, time out was performed. Pregnancy test was negative. Appropriate time out taken.  Patient's left arm was prepped and draped in the usual sterile fashion.. The ruler used to measure and mark insertion area.  Patient was prepped with alcohol swab and then injected with 1 cc of 1% lidocaine with epinephrine.  Patient was prepped with betadine, Nexplanon removed form packaging.  Device confirmed in needle, then inserted full length of needle and withdrawn per handbook instructions.  Patient insertion site covered with a band-aid and pressure dressing.   Minimal blood loss.  Patient tolerated the procedure well.  Patient was reminded of the DUB associated with Nexplanon and was asked to keep the device for at least a year before requesting its removal Patient advised to use condoms for the next 3 weeks

## 2016-12-27 ENCOUNTER — Encounter: Payer: Self-pay | Admitting: Obstetrics and Gynecology

## 2017-03-04 ENCOUNTER — Emergency Department (HOSPITAL_COMMUNITY)
Admission: EM | Admit: 2017-03-04 | Discharge: 2017-03-04 | Disposition: A | Discharge status: Home / Self Care | Payer: Medicaid Other | Attending: Emergency Medicine | Admitting: Emergency Medicine

## 2017-03-04 ENCOUNTER — Encounter (HOSPITAL_COMMUNITY): Payer: Self-pay

## 2017-03-04 DIAGNOSIS — S50811A Abrasion of right forearm, initial encounter: Secondary | ICD-10-CM | POA: Insufficient documentation

## 2017-03-04 DIAGNOSIS — Z7982 Long term (current) use of aspirin: Secondary | ICD-10-CM | POA: Diagnosis not present

## 2017-03-04 DIAGNOSIS — Y939 Activity, unspecified: Secondary | ICD-10-CM | POA: Diagnosis not present

## 2017-03-04 DIAGNOSIS — S0181XA Laceration without foreign body of other part of head, initial encounter: Secondary | ICD-10-CM

## 2017-03-04 DIAGNOSIS — Y999 Unspecified external cause status: Secondary | ICD-10-CM | POA: Insufficient documentation

## 2017-03-04 DIAGNOSIS — S80211A Abrasion, right knee, initial encounter: Secondary | ICD-10-CM | POA: Diagnosis not present

## 2017-03-04 DIAGNOSIS — Y929 Unspecified place or not applicable: Secondary | ICD-10-CM | POA: Insufficient documentation

## 2017-03-04 DIAGNOSIS — T07XXXA Unspecified multiple injuries, initial encounter: Secondary | ICD-10-CM

## 2017-03-04 LAB — POC URINE PREG, ED: Preg Test, Ur: NEGATIVE

## 2017-03-04 MED ORDER — IBUPROFEN 600 MG PO TABS: 600.0000 mg | ORAL_TABLET | Freq: Three times a day (TID) | ORAL | 0 refills | Status: AC | PRN

## 2017-03-04 MED ORDER — LIDOCAINE-EPINEPHRINE (PF) 2 %-1:200000 IJ SOLN
10.0000 mL | Freq: Once | INTRAMUSCULAR | Status: AC
  Administered 2017-03-04: 10 mL via INTRADERMAL
  Filled 2017-03-04: qty 20

## 2017-03-04 NOTE — Discharge Instructions (Signed)
Please have your sutures remove in 3-5 days.  Clean abrasions with gentle soap and water daily and apply neosporin over wound to prevent skin infection.
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
## 2017-03-04 NOTE — ED Notes (Signed)
ED Provider at bedside. 
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
## 2017-03-04 NOTE — ED Notes (Signed)
Taken to xray at this time. 

## 2018-03-24 IMAGING — US US MFM OB DETAIL+14 WK
1 series · 14 of 28 positions shown · non-contrast
Comparison: none

[Series 1: us mfm ob detail+14 wk · 68 acquisitions, 14 frames shown]
[im 3/68]
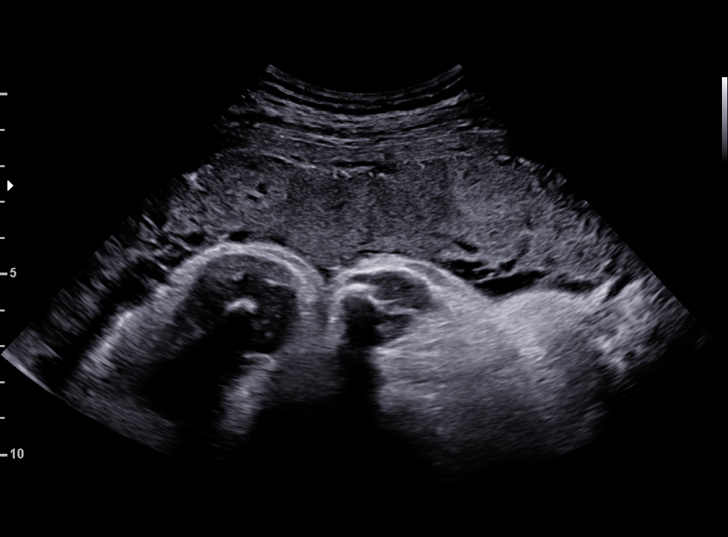
[im 8/68]
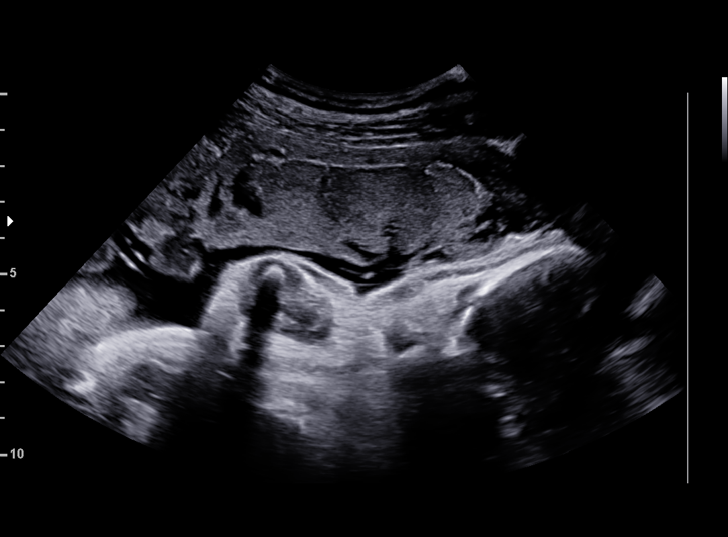
[im 13/68]
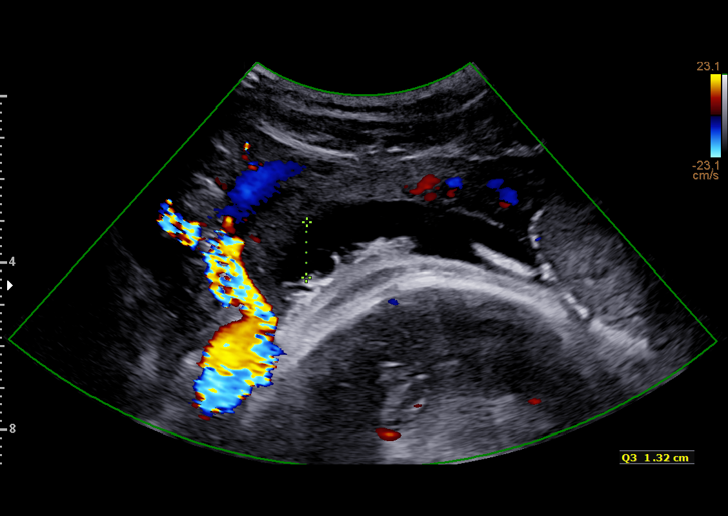
[im 18/68]
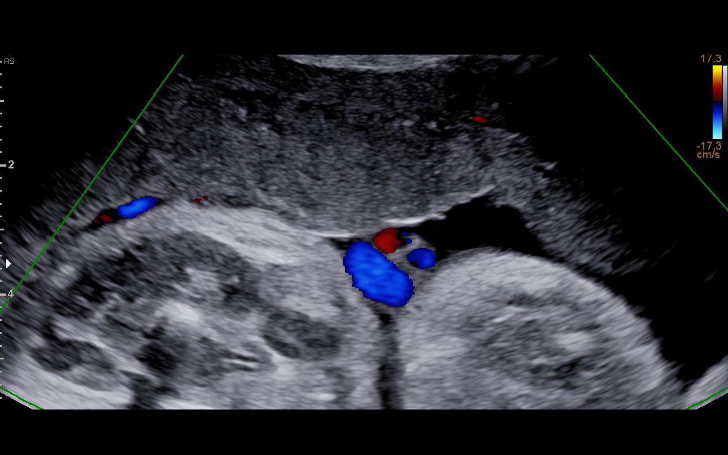
[im 23/68]
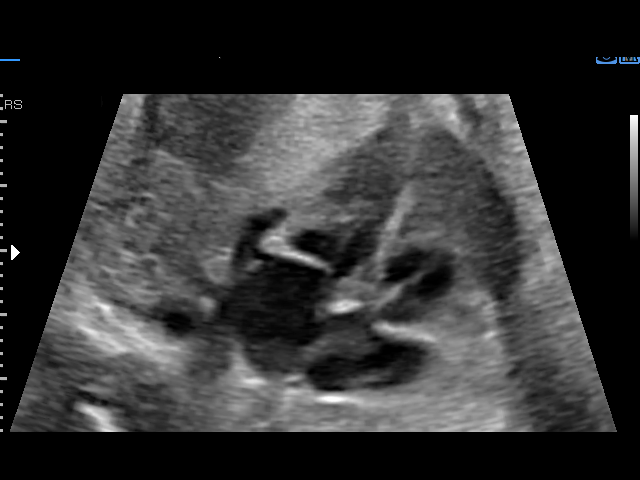
[im 28/68]
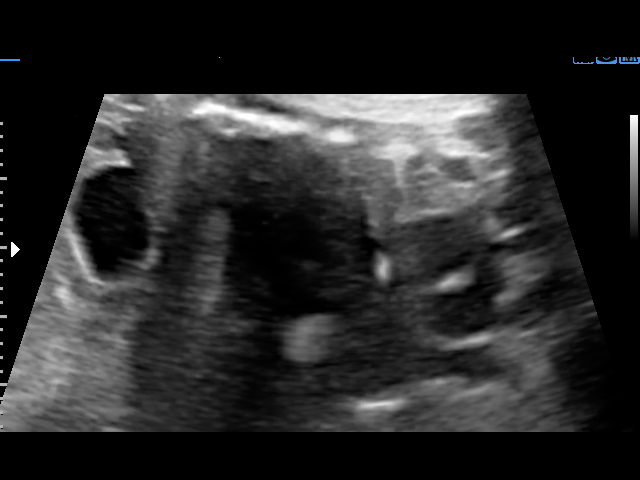
[im 33/68]
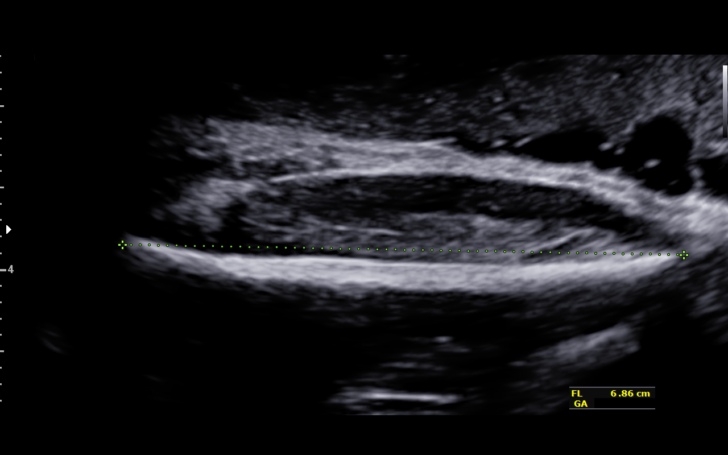
[im 38/68]
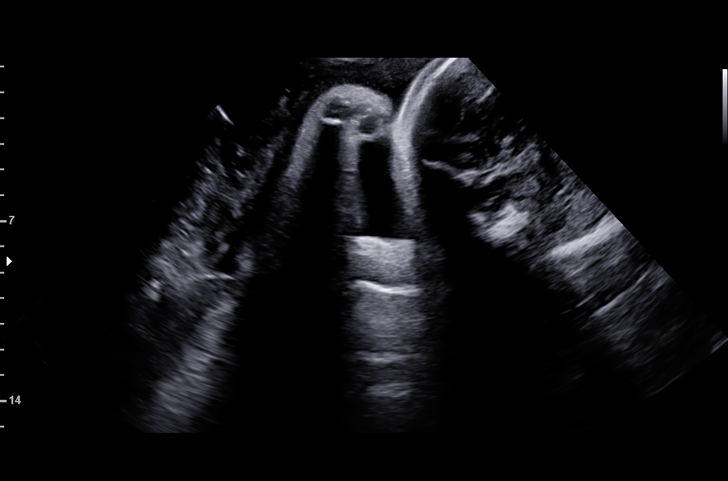
[im 43/68]
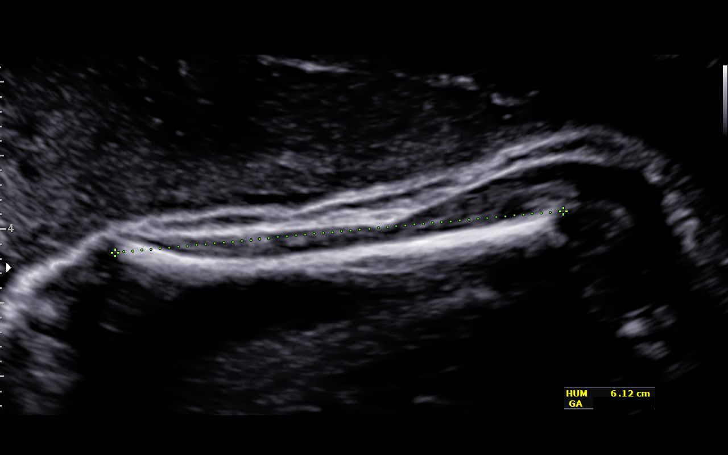
[im 48/68]
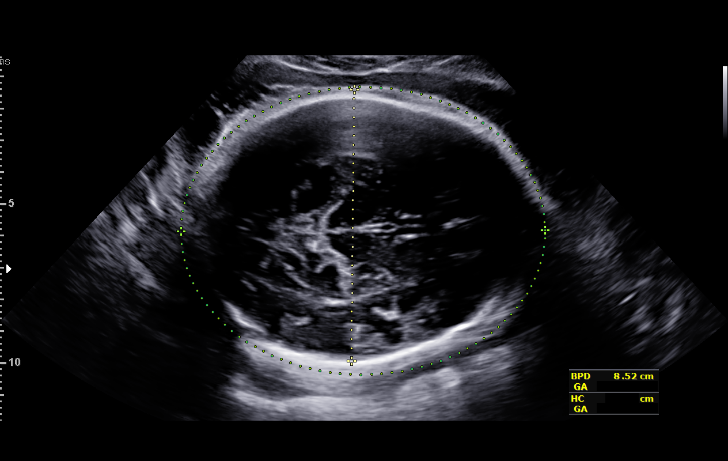
[im 53/68]
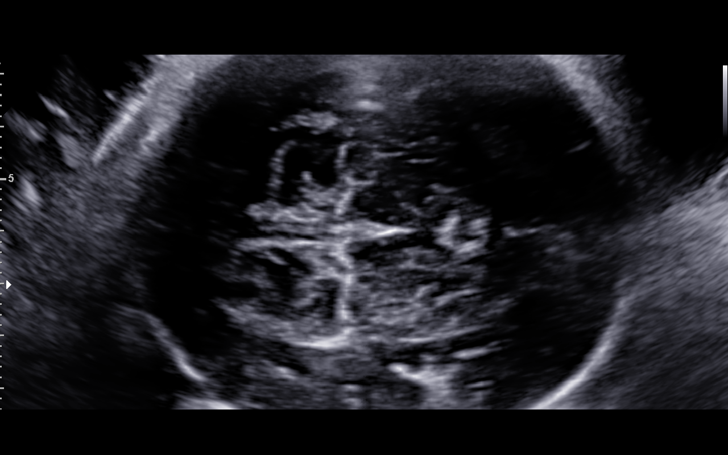
[im 58/68]
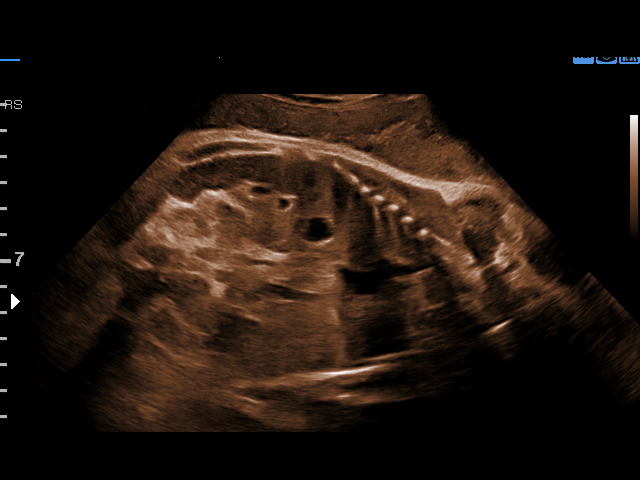
[im 63/68]
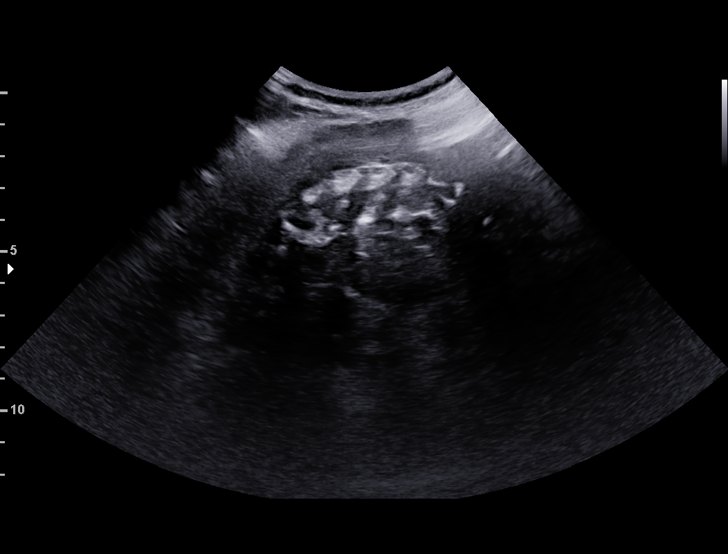
[im 68/68]
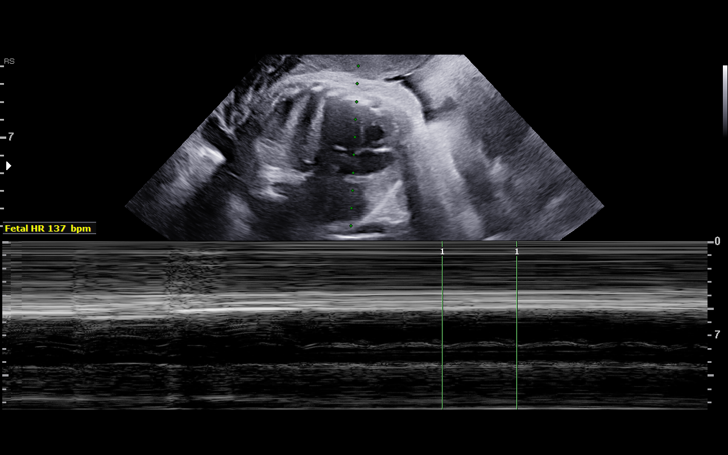

[14 of 28 positions shown; findings below may reference images not displayed]

Road [HOSPITAL]

Indications

37 weeks gestation of pregnancy
Encounter for antenatal screening for
malformations
Uterine size-date discrepancy, third trimester
Hypertension - Chronic/Pre-existing ( post
partum HTN )
OB History

Gravidity:    3         Term:   1         SAB:   1
Living:       1
Fetal Evaluation

Num Of Fetuses:     1
Fetal Heart         137
Rate(bpm):
Cardiac Activity:   Observed
Presentation:       Cephalic
Placenta:           Anterior, above cervical os
P. Cord Insertion:  Visualized, central

Amniotic Fluid
AFI FV:      Subjectively within normal limits

AFI Sum(cm)     %Tile       Largest Pocket(cm)
14.83           56

RUQ(cm)       RLQ(cm)       LUQ(cm)        LLQ(cm)
6.17
Biometry

BPD:      85.2  mm     G. Age:  34w 2d          4  %    CI:        71.29   %    70 - 86
FL/HC:      21.4   %    20.8 -
HC:      321.4  mm     G. Age:  36w 2d          9  %    HC/AC:      0.93        0.92 -
AC:      345.8  mm     G. Age:  38w 3d         90  %    FL/BPD:     80.6   %    71 - 87
FL:       68.7  mm     G. Age:  35w 2d          9  %    FL/AC:      19.9   %    20 - 24
HUM:      60.9  mm     G. Age:  35w 2d         34  %

Est. FW:    7075  gm    6 lb 13 oz      65  %
Gestational Age

LMP:           37w 2d        Date:  02/09/16                 EDD:   11/15/16
U/S Today:     36w 1d                                        EDD:   11/23/16
Best:          37w 2d     Det. By:  LMP  (02/09/16)          EDD:   11/15/16
Anatomy

Cranium:               Appears normal         Aortic Arch:            Appears normal
Cavum:                 Appears normal         Ductal Arch:            Not well visualized
Ventricles:            Not well visualized    Diaphragm:              Appears normal
Choroid Plexus:        Appears normal         Stomach:                Appears normal, left
sided
Cerebellum:            Not well visualized    Abdomen:                Appears normal
Posterior Fossa:       Not well visualized    Abdominal Wall:         Not well visualized
Nuchal Fold:           Not applicable (>20    Cord Vessels:           Appears normal (3
wks GA)                                        vessel cord)
Face:                  Not well visualized    Kidneys:                Appear normal
Lips:                  Not well visualized    Bladder:                Appears normal
Thoracic:              Appears normal         Spine:                  Not well visualized
Heart:                 Appears normal         Upper Extremities:      Visualized
(4CH, axis, and situs
RVOT:                  Not well visualized    Lower Extremities:      Visualized
LVOT:                  Not well visualized

Other:  Fetus appears to be a female. Technically difficult due to advanced
gestational age.
Cervix Uterus Adnexa

Cervix
Not visualized (advanced GA >44wks)

Uterus
No abnormality visualized.

Left Ovary
Within normal limits.

Right Ovary
Within normal limits.

Adnexa:       No abnormality visualized. No adnexal mass
visualized.
Impression

Singleton intrauterine pregnancy at 37+2 weeks, referred for
measurements size < dates last week. Patient's first child
was growth-restricted in the face of term preeclampsia.
No current signs of PIH
Review of the anatomy shows no sonographic markers for
aneuploidy or structural anomalies but are limited by the late
gestational age
Amniotic fluid volume is normal with an AFI of 14.8 cm
Estimated fetal weight is 7075 g which is growth in the 65th
percentile
Recommendations

There was no evidence of growth abnormality seen on
biometry, so formal SHAINE consultation was not performed.
Patient was reassured about the growth and development of
her baby.
Follow-up ultrasounds as clinically indicated.

## 2020-02-10 ENCOUNTER — Telehealth: Payer: Self-pay

## 2020-02-10 NOTE — Telephone Encounter (Signed)
Patient called wanting to schedule appointment to remove and replace birth control.

## 2020-02-11 NOTE — Telephone Encounter (Signed)
Return call to patient. Patient is not a current patient with our office. Encounter closed.
